# Patient Record
Sex: Female | Born: 1960 | Race: Black or African American | Hispanic: No | Smoking: Never smoker
Health system: Southern US, Community
[De-identification: ages and names within clinical notes are randomized; demographics above are authoritative.]

## PROBLEM LIST (undated history)

## (undated) DIAGNOSIS — N281 Cyst of kidney, acquired: Secondary | ICD-10-CM

## (undated) DIAGNOSIS — D649 Anemia, unspecified: Secondary | ICD-10-CM

## (undated) DIAGNOSIS — M1990 Unspecified osteoarthritis, unspecified site: Secondary | ICD-10-CM

## (undated) DIAGNOSIS — E785 Hyperlipidemia, unspecified: Secondary | ICD-10-CM

## (undated) DIAGNOSIS — G4730 Sleep apnea, unspecified: Secondary | ICD-10-CM

## (undated) DIAGNOSIS — K219 Gastro-esophageal reflux disease without esophagitis: Secondary | ICD-10-CM

## (undated) DIAGNOSIS — H269 Unspecified cataract: Secondary | ICD-10-CM

## (undated) DIAGNOSIS — N189 Chronic kidney disease, unspecified: Secondary | ICD-10-CM

## (undated) DIAGNOSIS — IMO0002 Reserved for concepts with insufficient information to code with codable children: Secondary | ICD-10-CM

## (undated) DIAGNOSIS — I10 Essential (primary) hypertension: Secondary | ICD-10-CM

## (undated) DIAGNOSIS — R011 Cardiac murmur, unspecified: Secondary | ICD-10-CM

## (undated) HISTORY — DX: Gastro-esophageal reflux disease without esophagitis: K21.9

## (undated) HISTORY — PX: FRACTURE SURGERY: SHX138

## (undated) HISTORY — PX: ABDOMINAL HYSTERECTOMY: SHX81

## (undated) HISTORY — PX: PARTIAL HYSTERECTOMY: SHX80

## (undated) HISTORY — DX: Cardiac murmur, unspecified: R01.1

## (undated) HISTORY — DX: Unspecified osteoarthritis, unspecified site: M19.90

## (undated) HISTORY — DX: Anemia, unspecified: D64.9

## (undated) HISTORY — DX: Hyperlipidemia, unspecified: E78.5

## (undated) HISTORY — DX: Reserved for concepts with insufficient information to code with codable children: IMO0002

## (undated) HISTORY — DX: Cyst of kidney, acquired: N28.1

## (undated) HISTORY — DX: Unspecified cataract: H26.9

---

## 2020-03-22 ENCOUNTER — Encounter: Payer: Self-pay | Admitting: Nurse Practitioner

## 2020-03-22 ENCOUNTER — Ambulatory Visit (INDEPENDENT_AMBULATORY_CARE_PROVIDER_SITE_OTHER): Admitting: Nurse Practitioner

## 2020-03-22 ENCOUNTER — Other Ambulatory Visit: Payer: Self-pay | Admitting: Nurse Practitioner

## 2020-03-22 ENCOUNTER — Other Ambulatory Visit: Payer: Self-pay

## 2020-03-22 VITALS — BP 148/80 | HR 70 | Temp 97.9°F | Ht 68.8 in | Wt 222.8 lb

## 2020-03-22 DIAGNOSIS — I10 Essential (primary) hypertension: Secondary | ICD-10-CM

## 2020-03-22 DIAGNOSIS — R609 Edema, unspecified: Secondary | ICD-10-CM | POA: Diagnosis not present

## 2020-03-22 DIAGNOSIS — Z1159 Encounter for screening for other viral diseases: Secondary | ICD-10-CM

## 2020-03-22 DIAGNOSIS — Z13228 Encounter for screening for other metabolic disorders: Secondary | ICD-10-CM | POA: Diagnosis not present

## 2020-03-22 MED ORDER — HYDROCHLOROTHIAZIDE 12.5 MG PO TABS: 12.5000 mg | ORAL_TABLET | Freq: Every day | ORAL | 1 refills | Status: DC

## 2020-03-22 NOTE — Patient Instructions (Signed)

## 2020-03-22 NOTE — Progress Notes (Signed)
This visit occurred during the SARS-CoV-2 public health emergency.  Safety protocols were in place, including screening questions prior to the visit, additional usage of staff PPE, and extensive cleaning of exam room while observing appropriate contact time as indicated for disinfecting solutions.  Subjective:     Patient ID: Nicole Hunt , female    DOB: [birth year] , 59 y.o.   MRN: [id number]   Chief Complaint  Patient presents with  . Establish Care    HPI  Here to establish care, recently retired from the [employer] last year with 30 years of service.  She is divorced. No children.  [place] is home for her she is caring for her mother who is 42 years old.  She is not currently working just caring for her mother.    PMH - history of having a cyst in her left kidney (while she was in [place]).  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  History of mild case of sleep apnea - since being out of the [place] has been sleeping better.  Benign heart murmur.  Right foot with arthritis.   Mother breast cancer - double mastectomy, father - stroke (deceased) , hypertension.    She does report wanting to lose weight. Goal to get to 170-180's.  She wants to continue lifting weights.  Left knee swelling  Hypertension This is a chronic problem. The current episode started more than 1 year ago. The problem is uncontrolled. Pertinent negatives include no anxiety, headaches or shortness of breath. There are no associated agents to hypertension. Risk factors for coronary artery disease include obesity. There are no compliance problems.  There is no history of angina. There is no history of chronic renal disease.     Past Medical History:  Diagnosis Date  . Arthritis   . Heart murmur      Family History  Problem Relation Age of Onset  . Cancer Mother   . Hypothyroidism Mother   . Arthritis Mother   . Stroke Father   .  Hypertension Father   . Hypertension Sister   . Hypertension Brother   . Healthy Brother   . Congestive Heart Failure Maternal Grandmother     No current outpatient medications on file.   No Known Allergies   Review of Systems  Constitutional: Negative.   Respiratory: Negative.  Negative for shortness of breath.   Cardiovascular: Negative.   Neurological: Negative for dizziness and headaches.  Psychiatric/Behavioral: Negative.      Today's Vitals   03/22/20 1511  BP: (!) 164/96  Pulse: 70  Temp: 97.9 F (36.6 C)  TempSrc: Oral  Weight: 222 lb 12.8 oz (101.1 kg)  Height: 5' 8.8" (1.748 m)  PainSc: 0-No pain   Body mass index is 33.09 kg/m.   Objective:  Physical Exam Vitals reviewed.  Constitutional:      General: She is not in acute distress.    Appearance: She is well-developed. She is obese.  HENT:     Head: Normocephalic and atraumatic.  Eyes:     Pupils: Pupils are equal, round, and reactive to light.  Cardiovascular:     Rate and Rhythm: Normal rate and regular rhythm.     Pulses: Normal pulses.     Heart sounds: Normal heart sounds. No murmur heard.   Pulmonary:     Effort: Pulmonary effort is normal. No respiratory distress.     Breath sounds: Normal breath sounds. No wheezing.  Musculoskeletal:  General: Normal range of motion.  Skin:    General: Skin is warm and dry.     Capillary Refill: Capillary refill takes less than 2 seconds.  Neurological:     General: No focal deficit present.     Mental Status: She is alert and oriented to person, place, and time.     Cranial Nerves: No cranial nerve deficit.  Psychiatric:        Mood and Affect: Mood normal.        Thought Content: Thought content normal.        Judgment: Judgment normal.         Assessment And Plan:     1. Encounter for screening for metabolic disorder  - Lipid panel - CBC - Hemoglobin A1c  2. Encounter for hepatitis C screening test for low risk patient  Will  check Hepatitis C screening due to recent recommendations to screen all adults 18 years and older - Hepatitis C antibody  3. Essential hypertension . B/P is elevated today, she is to limit her salt intake and monitor her blood pressure before starting her on medications  . CMP ordered to check renal function.  . The importance of regular exercise and dietary modification was stressed to the patient.  . Stressed importance of losing ten percent of her body weight to help with B/P control.  . The weight loss would help with decreasing cardiac and cancer risk as well.  . EKG done with SR HR 73 - CMP14+EGFR - EKG 12-Lead  4. Edema, unspecified type  Trace edema, encouraged to elevate feet when possible and limit salt intake.  Minette Brine, FNP    THE PATIENT IS ENCOURAGED TO PRACTICE SOCIAL DISTANCING DUE TO THE COVID-19 PANDEMIC.

## 2020-03-23 LAB — CMP14+EGFR
ALT: 34 IU/L — ABNORMAL HIGH (ref 0–32)
AST: 36 IU/L (ref 0–40)
Albumin/Globulin Ratio: 1.3 (ref 1.2–2.2)
Albumin: 4.5 g/dL (ref 3.8–4.9)
Alkaline Phosphatase: 89 IU/L (ref 48–121)
BUN/Creatinine Ratio: 10 (ref 9–23)
BUN: 13 mg/dL (ref 6–24)
Bilirubin Total: 1.7 mg/dL — ABNORMAL HIGH (ref 0.0–1.2)
CO2: 22 mmol/L (ref 20–29)
Calcium: 10.1 mg/dL (ref 8.7–10.2)
Chloride: 106 mmol/L (ref 96–106)
Creatinine, Ser: 1.35 mg/dL — ABNORMAL HIGH (ref 0.57–1.00)
GFR calc Af Amer: 51 mL/min/{1.73_m2} — ABNORMAL LOW (ref >59)
GFR calc non Af Amer: 44 mL/min/{1.73_m2} — ABNORMAL LOW (ref >59)
Globulin, Total: 3.4 g/dL (ref 1.5–4.5)
Glucose: 88 mg/dL (ref 65–99)
Potassium: 4.2 mmol/L (ref 3.5–5.2)
Sodium: 140 mmol/L (ref 134–144)
Total Protein: 7.9 g/dL (ref 6.0–8.5)

## 2020-03-23 LAB — LIPID PANEL
Chol/HDL Ratio: 4.4 ratio (ref 0.0–4.4)
Cholesterol, Total: 189 mg/dL (ref 100–199)
HDL: 43 mg/dL (ref >39)
LDL Chol Calc (NIH): 131 mg/dL — ABNORMAL HIGH (ref 0–99)
Triglycerides: 82 mg/dL (ref 0–149)
VLDL Cholesterol Cal: 15 mg/dL (ref 5–40)

## 2020-03-23 LAB — CBC
Hematocrit: 40.2 % (ref 34.0–46.6)
Hemoglobin: 13.3 g/dL (ref 11.1–15.9)
MCH: 27.5 pg (ref 26.6–33.0)
MCHC: 33.1 g/dL (ref 31.5–35.7)
MCV: 83 fL (ref 79–97)
Platelets: 275 10*3/uL (ref 150–450)
RBC: 4.84 x10E6/uL (ref 3.77–5.28)
RDW: 12.6 % (ref 11.7–15.4)
WBC: 4.8 10*3/uL (ref 3.4–10.8)

## 2020-03-23 LAB — HEMOGLOBIN A1C
Est. average glucose Bld gHb Est-mCnc: 100 mg/dL
Hgb A1c MFr Bld: 5.1 % (ref 4.8–5.6)

## 2020-03-23 LAB — HEPATITIS C ANTIBODY: Hep C Virus Ab: <0.1 s/co ratio (ref 0.0–0.9)

## 2020-04-03 ENCOUNTER — Other Ambulatory Visit: Payer: Self-pay

## 2020-04-03 ENCOUNTER — Ambulatory Visit

## 2020-04-03 VITALS — BP 138/80 | HR 78 | Temp 97.5°F | Ht 68.8 in | Wt 221.2 lb

## 2020-04-03 DIAGNOSIS — I10 Essential (primary) hypertension: Secondary | ICD-10-CM

## 2020-04-03 NOTE — Progress Notes (Signed)
Pt is here for blood pressure check. Pt was put on hctz 12.5 at last visit due to blood pressure being 164/96. Today her blood pressure is 138/80. Pt has folow -up with provider in a month

## 2020-04-04 LAB — CMP14+EGFR
ALT: 22 IU/L (ref 0–32)
AST: 40 IU/L (ref 0–40)
Albumin/Globulin Ratio: 1.5 (ref 1.2–2.2)
Albumin: 4.4 g/dL (ref 3.8–4.9)
Alkaline Phosphatase: 83 IU/L (ref 48–121)
BUN/Creatinine Ratio: 10 (ref 9–23)
BUN: 14 mg/dL (ref 6–24)
Bilirubin Total: 1.5 mg/dL — ABNORMAL HIGH (ref 0.0–1.2)
CO2: 24 mmol/L (ref 20–29)
Calcium: 10.6 mg/dL — ABNORMAL HIGH (ref 8.7–10.2)
Chloride: 102 mmol/L (ref 96–106)
Creatinine, Ser: 1.38 mg/dL — ABNORMAL HIGH (ref 0.57–1.00)
GFR calc Af Amer: 49 mL/min/{1.73_m2} — ABNORMAL LOW (ref >59)
GFR calc non Af Amer: 43 mL/min/{1.73_m2} — ABNORMAL LOW (ref >59)
Globulin, Total: 3 g/dL (ref 1.5–4.5)
Glucose: 93 mg/dL (ref 65–99)
Potassium: 4.7 mmol/L (ref 3.5–5.2)
Sodium: 142 mmol/L (ref 134–144)
Total Protein: 7.4 g/dL (ref 6.0–8.5)

## 2020-05-02 ENCOUNTER — Encounter: Payer: Self-pay | Admitting: Nurse Practitioner

## 2020-05-03 ENCOUNTER — Other Ambulatory Visit: Payer: Self-pay

## 2020-05-03 ENCOUNTER — Encounter: Payer: Self-pay | Admitting: Nurse Practitioner

## 2020-05-03 ENCOUNTER — Ambulatory Visit (INDEPENDENT_AMBULATORY_CARE_PROVIDER_SITE_OTHER): Admitting: Nurse Practitioner

## 2020-05-03 VITALS — BP 158/96 | HR 81 | Temp 97.8°F | Ht 68.8 in | Wt 212.4 lb

## 2020-05-03 DIAGNOSIS — Z87448 Personal history of other diseases of urinary system: Secondary | ICD-10-CM

## 2020-05-03 DIAGNOSIS — I10 Essential (primary) hypertension: Secondary | ICD-10-CM

## 2020-05-03 DIAGNOSIS — N183 Chronic kidney disease, stage 3 unspecified: Secondary | ICD-10-CM | POA: Insufficient documentation

## 2020-05-03 DIAGNOSIS — Z1231 Encounter for screening mammogram for malignant neoplasm of breast: Secondary | ICD-10-CM | POA: Diagnosis not present

## 2020-05-03 MED ORDER — MAGNESIUM 250 MG PO TABS: 1.0000 | ORAL_TABLET | Freq: Every evening | ORAL | 1 refills | Status: AC

## 2020-05-03 NOTE — Progress Notes (Signed)
I,Yamilka Roman [employer] as a Education administrator for [employer], FNP.,have documented all relevant documentation on the behalf of Minette Brine, FNP,as directed by  Minette Brine, FNP while in the presence of Minette Brine, [place].  This visit occurred during the SARS-CoV-2 public health emergency.  Safety protocols were in place, including screening questions prior to the visit, additional usage of staff PPE, and extensive cleaning of exam room while observing appropriate contact time as indicated for disinfecting solutions.  Subjective:     Patient ID: Nicole Hunt , female    DOB: [birth year] , 59 y.o.   MRN: [id number]   Chief Complaint  Patient presents with  . Hypertension    Med f/u     HPI  Here for recheck blood pressure and swelling in feet.  She says she now can see her ankles. She just drank water today.    At home blood pressure at home was 144/59.      Past Medical History:  Diagnosis Date  . Arthritis   . Heart murmur      Family History  Problem Relation Age of Onset  . Cancer Mother   . Hypothyroidism Mother   . Arthritis Mother   . Stroke Father   . Hypertension Father   . Hypertension Sister   . Hypertension Brother   . Healthy Brother   . Congestive Heart Failure Maternal Grandmother      Current Outpatient Medications:  .  hydrochlorothiazide (HYDRODIURIL) 12.5 MG tablet, TAKE 1 TABLET(12.5 MG) BY MOUTH DAILY, Disp: 90 tablet, Rfl: 0 .  Magnesium 250 MG TABS, Take 1 tablet (250 mg total) by mouth every evening. Take with evening meal., Disp: 90 tablet, Rfl: 1   No Known Allergies   Review of Systems  Constitutional: Negative.  Negative for fatigue.  Respiratory: Negative.  Negative for cough, shortness of breath and wheezing.   Cardiovascular: Negative.  Negative for chest pain, palpitations and leg swelling.  Genitourinary: Negative.   Neurological: Negative for dizziness and headaches.  Psychiatric/Behavioral: Negative.      Today's Vitals    05/03/20 1043  BP: (!) 158/96  Pulse: 81  Temp: 97.8 F (36.6 C)  TempSrc: Oral  Weight: 212 lb 6.4 oz (96.3 kg)  Height: 5' 8.8" (1.748 m)  PainSc: 0-No pain   Body mass index is 31.55 kg/m.   Objective:  Physical Exam Vitals reviewed.  Constitutional:      General: She is not in acute distress.    Appearance: Normal appearance. She is well-developed.  HENT:     Head: Normocephalic and atraumatic.  Eyes:     Pupils: Pupils are equal, round, and reactive to light.  Cardiovascular:     Rate and Rhythm: Normal rate and regular rhythm.     Pulses: Normal pulses.     Heart sounds: Normal heart sounds. No murmur heard.   Pulmonary:     Effort: Pulmonary effort is normal. No respiratory distress.     Breath sounds: Normal breath sounds. No wheezing.  Musculoskeletal:        General: Normal range of motion.     Right lower leg: No edema.     Left lower leg: No edema.  Skin:    General: Skin is warm and dry.     Capillary Refill: Capillary refill takes less than 2 seconds.  Neurological:     General: No focal deficit present.     Mental Status: She is alert and oriented to person, place, and time.  Cranial Nerves: No cranial nerve deficit.  Psychiatric:        Mood and Affect: Mood normal.        Behavior: Behavior normal.        Thought Content: Thought content normal.        Judgment: Judgment normal.         Assessment And Plan:     1. Essential hypertension  Chronic, this is better slightly from her last visit   Her blood pressure at home is better as well   I will add magnesium and she is to check her blood pressure at home daily and send [place] the readings weekly  Return in 4 weeks for nurse visit BP check - Magnesium 250 MG TABS; Take 1 tablet (250 mg total) by mouth every evening. Take with evening meal.  Dispense: 90 tablet; Refill: 1 - US Renal; Future  2. Personal history of simple renal cyst  Reports a history over the last 20 years that was  managed by the [place], I will check a renal ultrasound before referring to nephrology - US Renal; Future  3. Encounter for screening mammogram for malignant neoplasm of breast  Pt instructed on Self Breast Exam.According to ACOG guidelines Women aged 64 and older are recommended to get an annual mammogram. Form completed and given to patient contact the [facility] for appointment scheduing.   Pt encouraged to get annual mammogram - MM DIGITAL SCREENING BILATERAL; Future    Patient was given opportunity to ask questions. Patient verbalized understanding of the plan and was able to repeat key elements of the plan. All questions were answered to their satisfaction.  Minette Brine, FNP   I, Minette Brine, FNP, have reviewed all documentation for this visit. The documentation on 05/03/20 for the exam, diagnosis, procedures, and orders are all accurate and complete.  THE PATIENT IS ENCOURAGED TO PRACTICE SOCIAL DISTANCING DUE TO THE COVID-19 PANDEMIC.

## 2020-05-10 ENCOUNTER — Ambulatory Visit
Admission: RE | Admit: 2020-05-10 | Discharge: 2020-05-10 | Disposition: A | Discharge status: Home / Self Care | Source: Ambulatory Visit | Attending: Nurse Practitioner | Admitting: Nurse Practitioner

## 2020-05-10 DIAGNOSIS — Z87448 Personal history of other diseases of urinary system: Secondary | ICD-10-CM

## 2020-05-10 DIAGNOSIS — I10 Essential (primary) hypertension: Secondary | ICD-10-CM

## 2020-05-10 IMAGING — US US RENAL
1 series · 14 of 25 positions shown · non-contrast
Comparison: None.

CLINICAL DATA: Renal cyst.

EXAM:
RENAL / URINARY TRACT ULTRASOUND COMPLETE

[Series 1: us renal · 0.23mm/px · 14 of 37 slices shown]
[im 1/37]
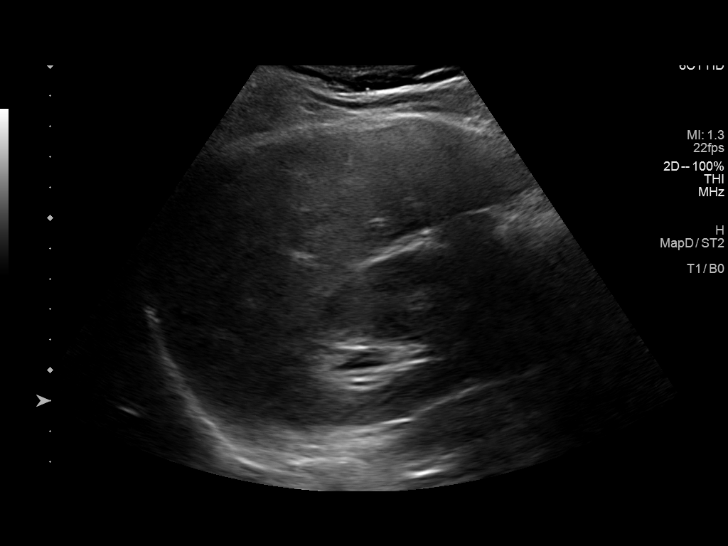
[im 4/37]
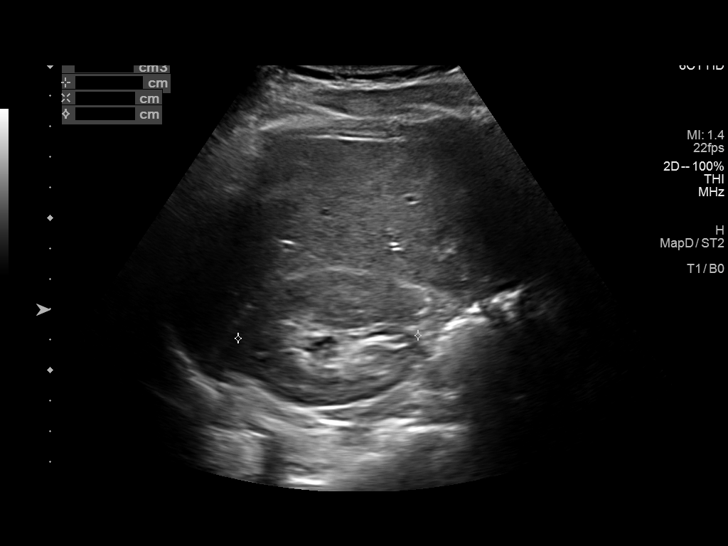
[im 7/37]
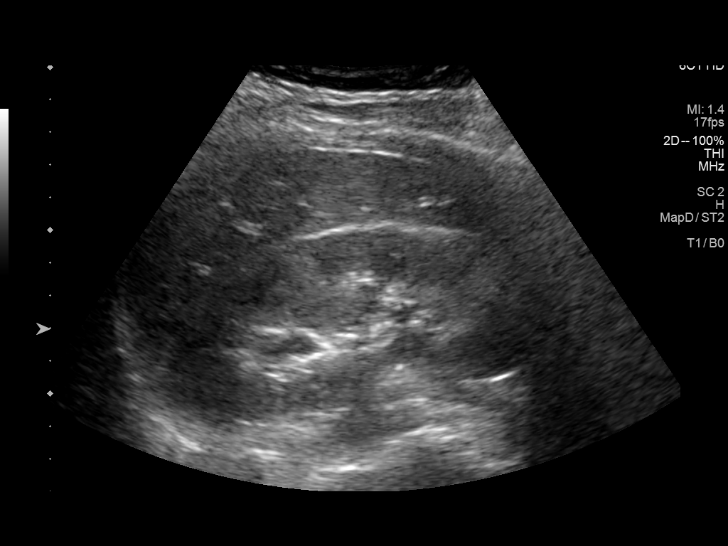
[im 10/37]
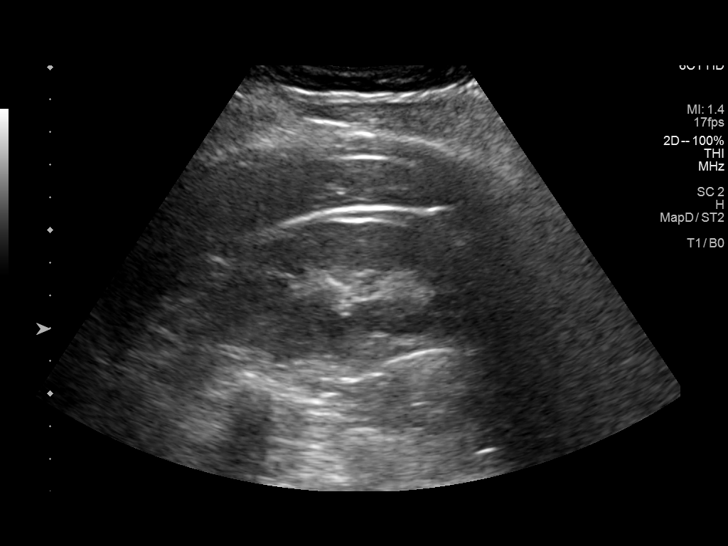
[im 13/37]
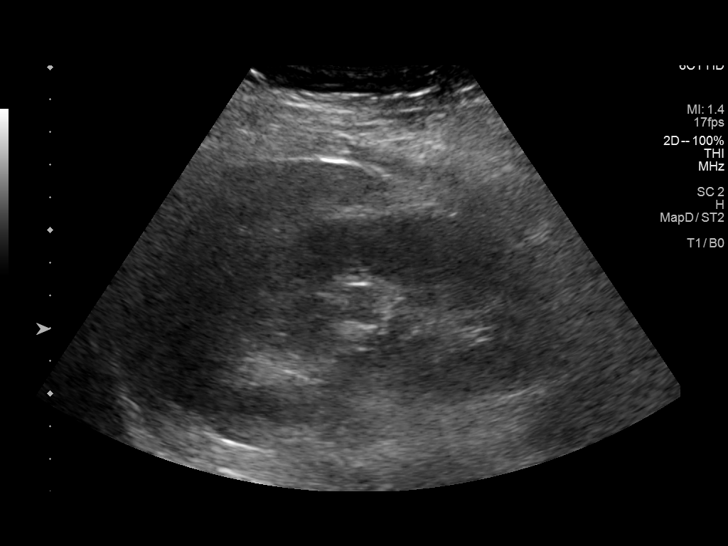
[im 14/37]
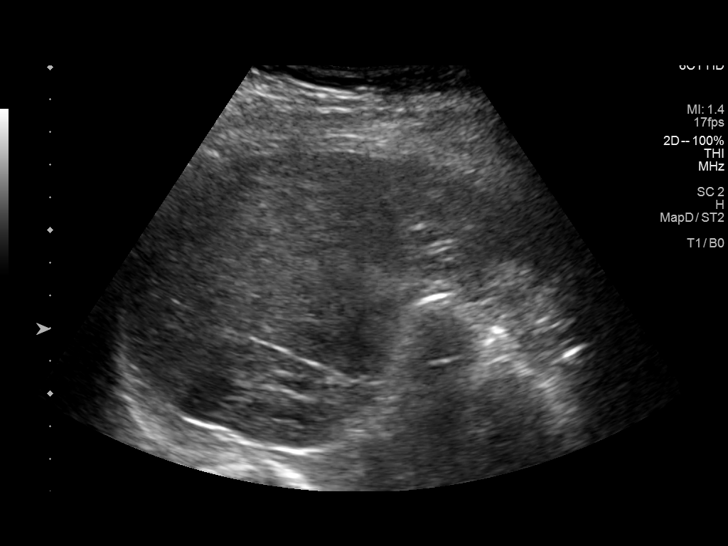
[im 17/37]
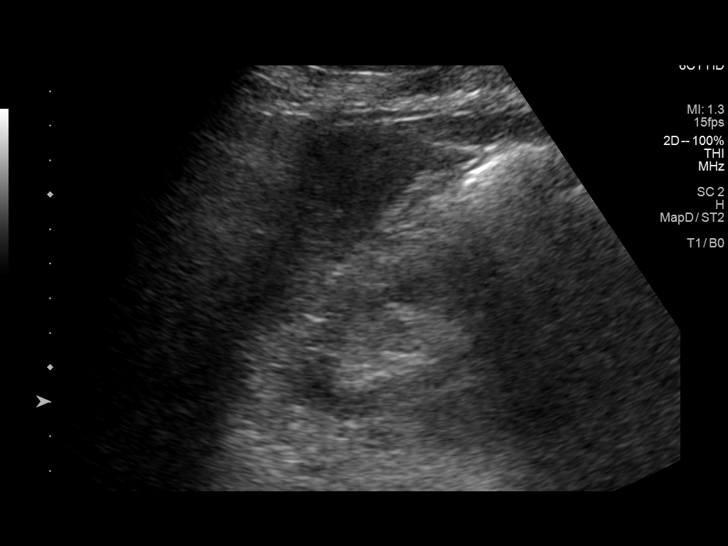
[im 20/37]
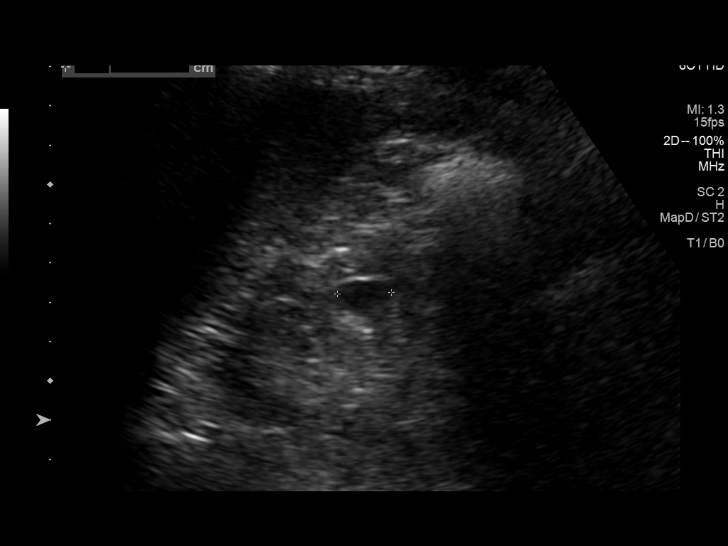
[im 23/37]
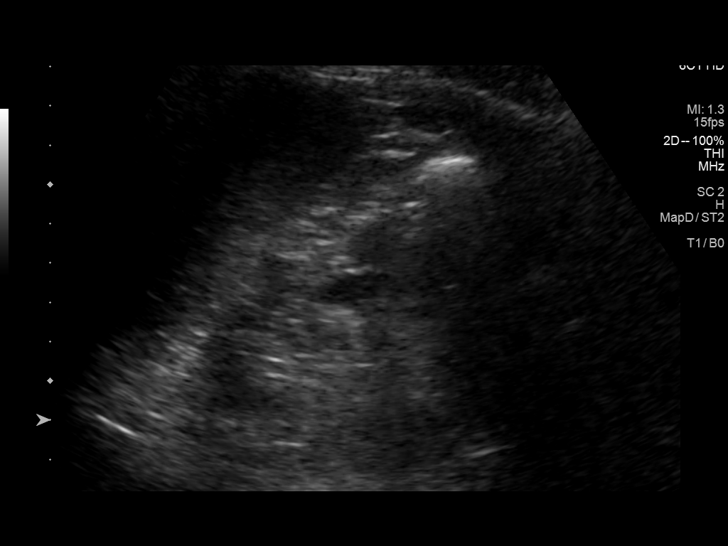
[im 25/37]
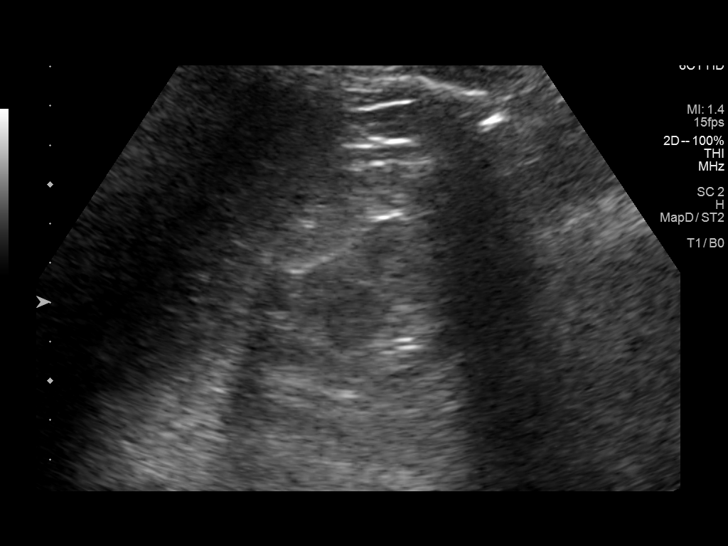
[im 28/37]
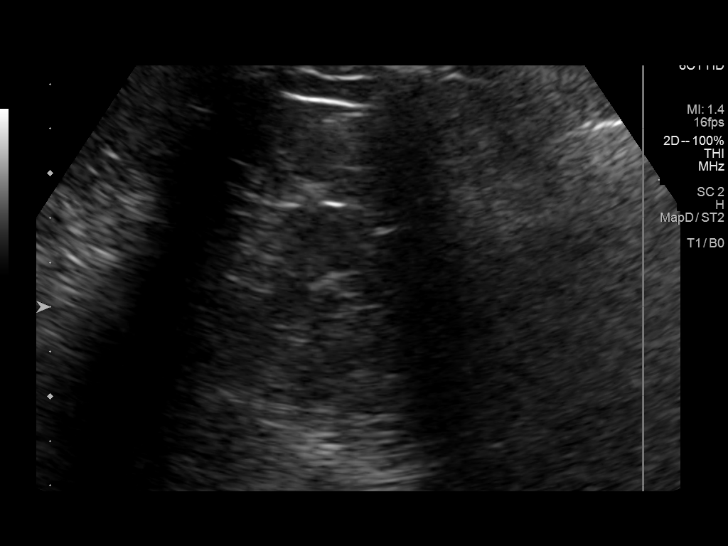
[im 31/37]
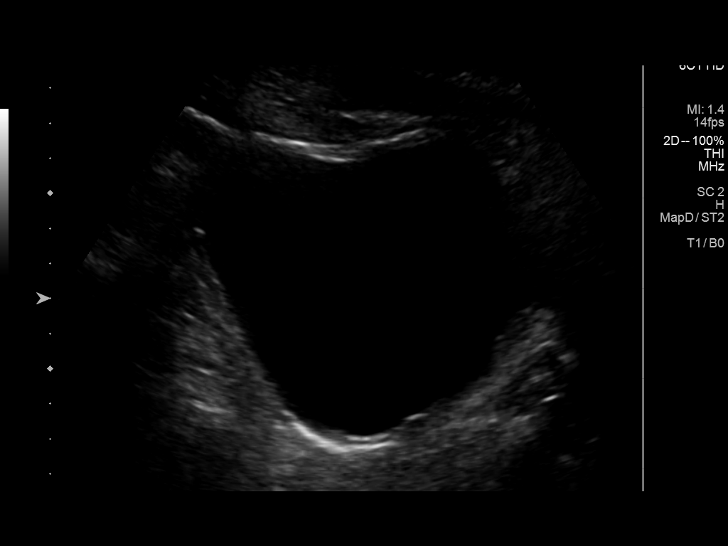
[im 34/37]
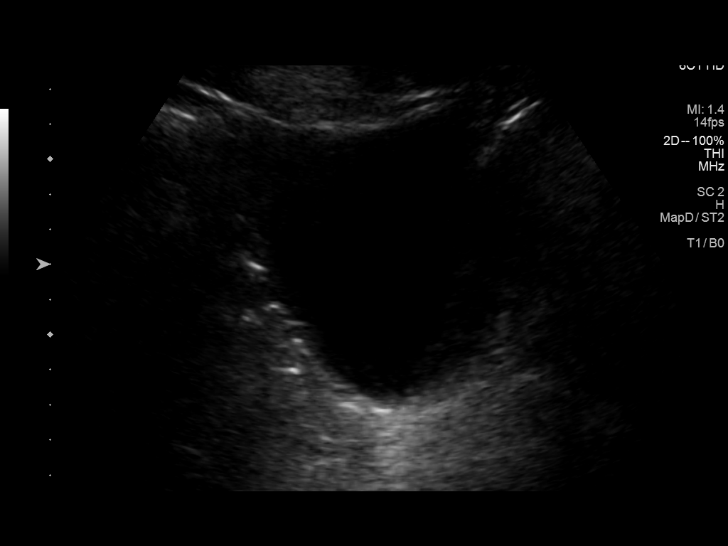
[im 37/37]
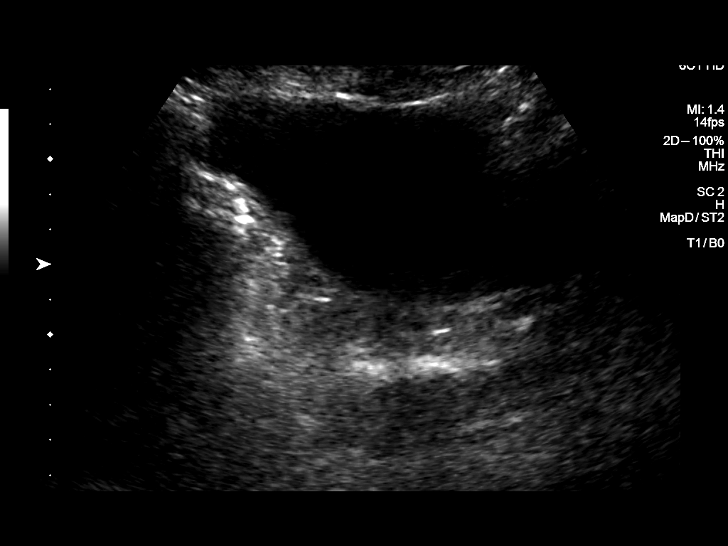

[14 of 25 positions shown; findings below may reference images not displayed]

FINDINGS: Right Kidney:

Renal measurements: 11.9 x 4.4 x 3.9 cm = volume: 159 mL.
Echogenicity within normal limits. No mass or hydronephrosis
visualized.

Left Kidney:

Renal measurements: Poorly visualized, likely as a result of
increased echogenicity and overlying bowel gas. Potential tiny 10 mm
hypoechoic lesion in the interpolar region. No definite
hydronephrosis.

Bladder:

Appears normal for degree of bladder distention.

Other:

Large volume of bowel gas in the left abdomen.
IMPRESSION: 1. Limited visualization of the left kidney. Potential 10 mm
hypoechoic lesion in the interpolar region. MRI of the abdomen
without and with contrast recommended to further evaluate.
2. Unremarkable right kidney.

## 2020-05-23 ENCOUNTER — Ambulatory Visit
Admission: RE | Admit: 2020-05-23 | Discharge: 2020-05-23 | Disposition: A | Discharge status: Home / Self Care | Source: Ambulatory Visit | Attending: Nurse Practitioner | Admitting: Nurse Practitioner

## 2020-05-23 ENCOUNTER — Other Ambulatory Visit: Payer: Self-pay

## 2020-05-23 DIAGNOSIS — Z1231 Encounter for screening mammogram for malignant neoplasm of breast: Secondary | ICD-10-CM

## 2020-05-23 IMAGING — MG DIGITAL SCREENING BILAT W/ CAD
4 series · 4 of 4 positions shown · non-contrast
Comparison: Previous exam(s).

CLINICAL DATA: Screening.

EXAM:
DIGITAL SCREENING BILATERAL MAMMOGRAM WITH CAD

[L CC]
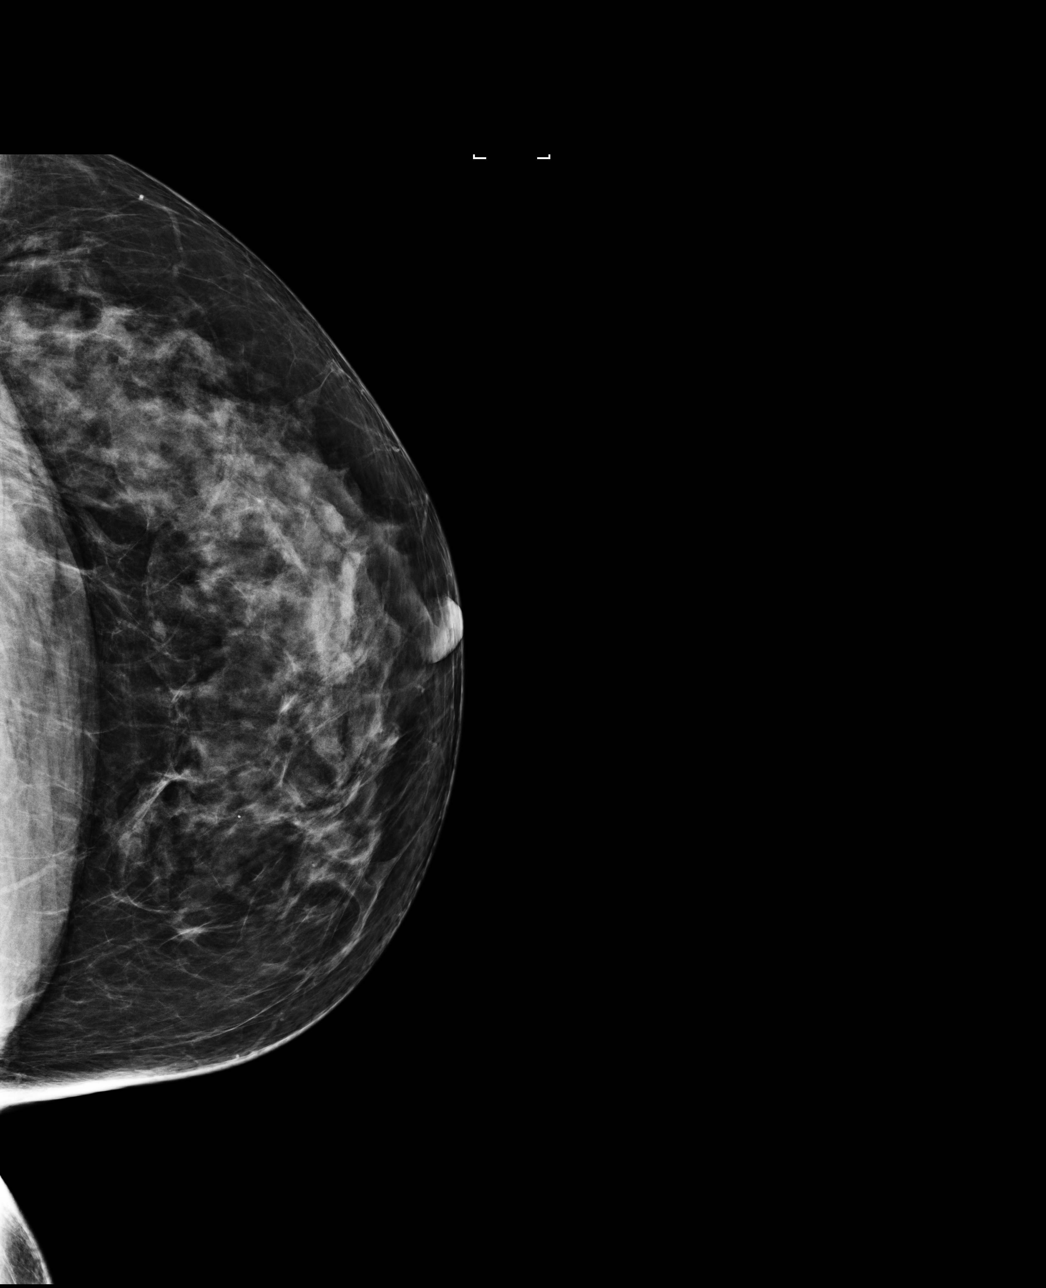

[R MLO]
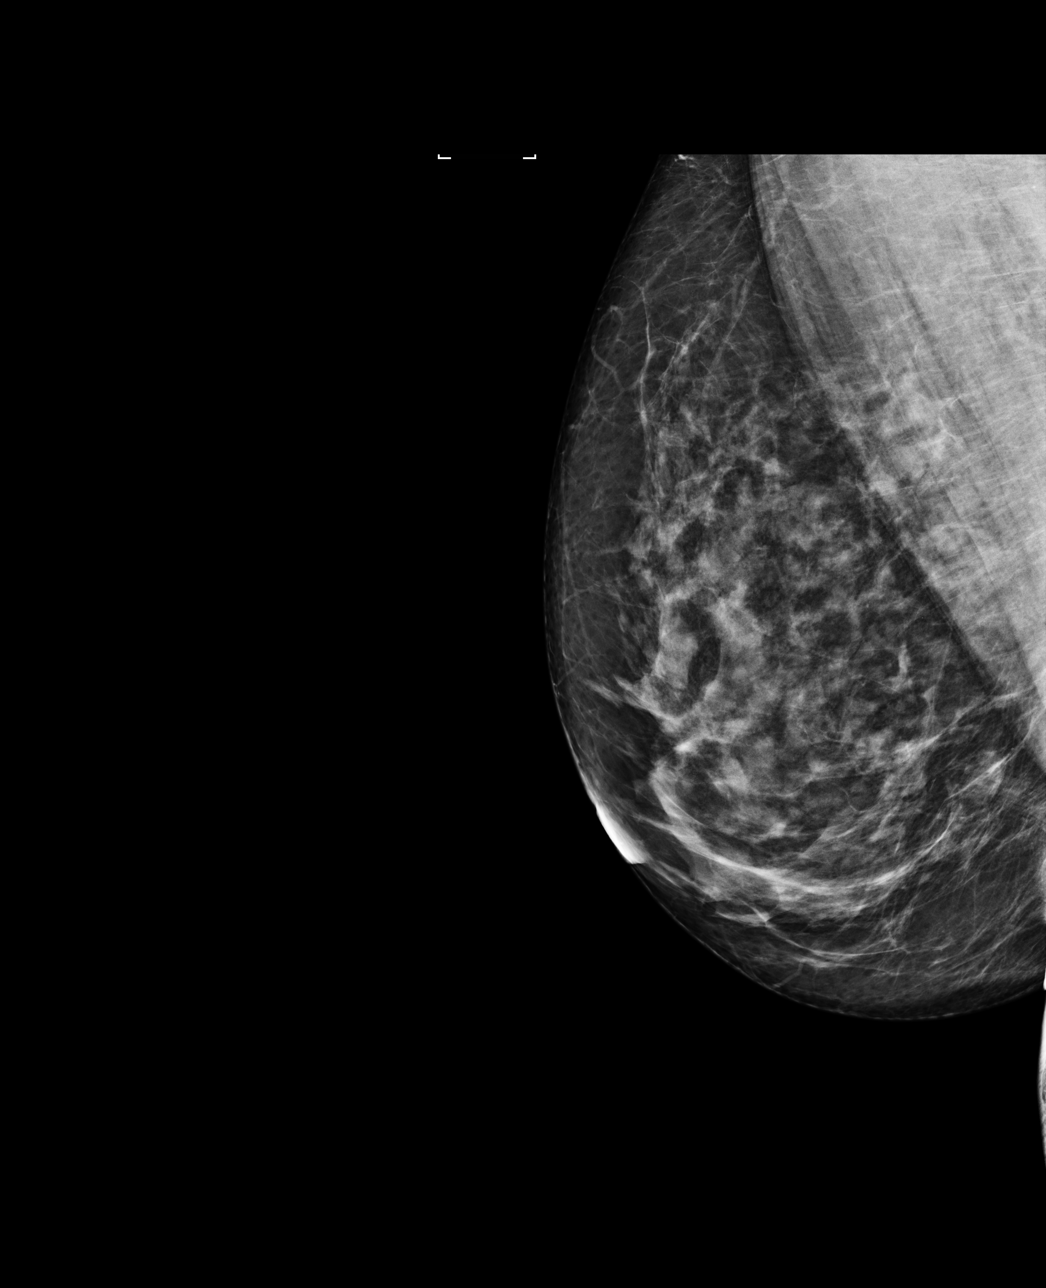

[R CC]
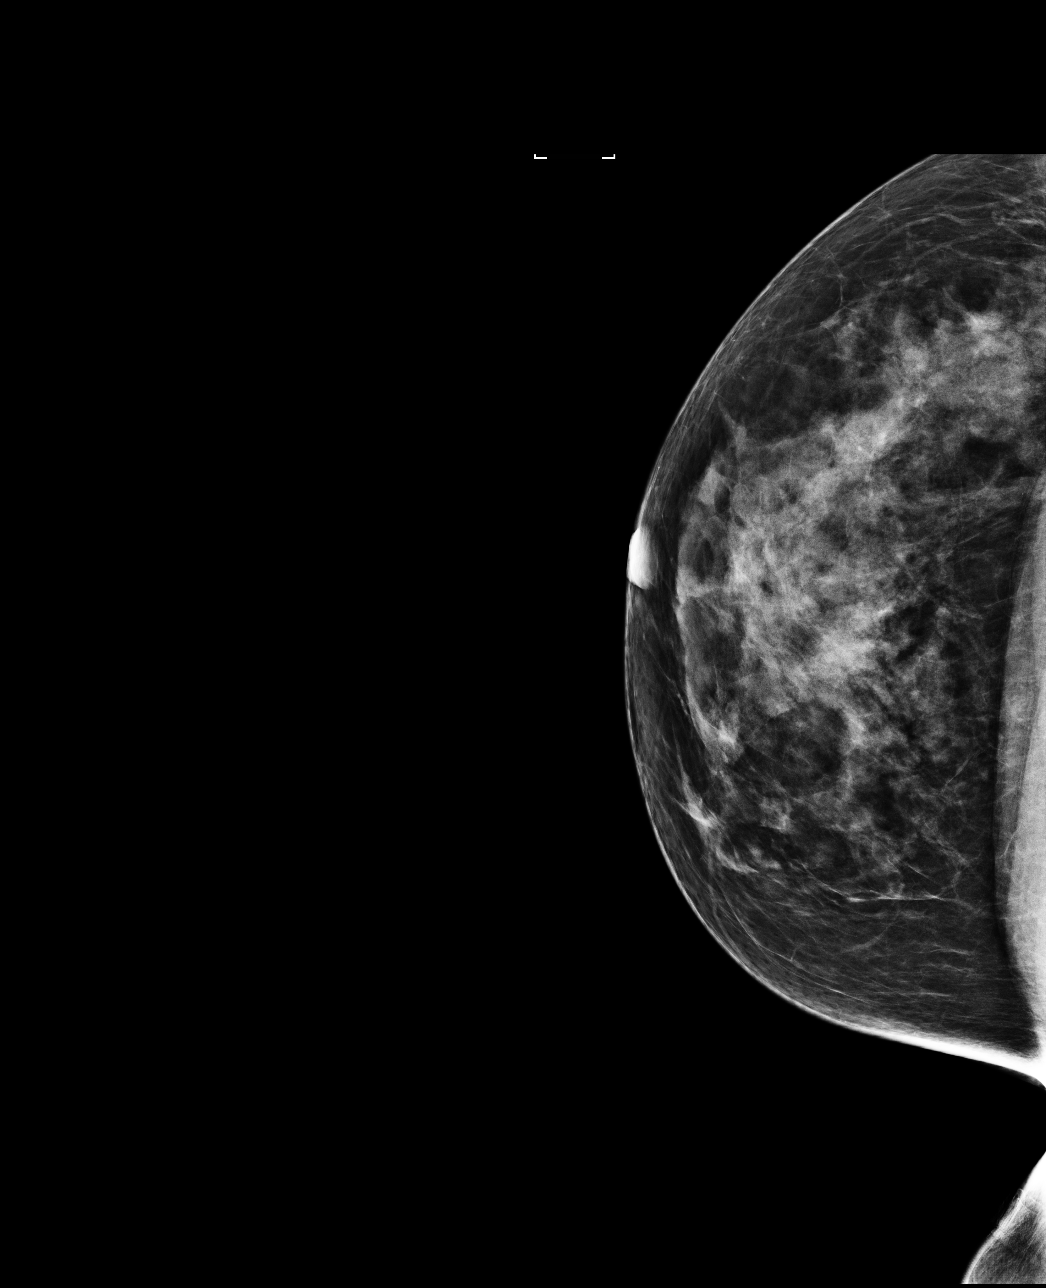

[L MLO]
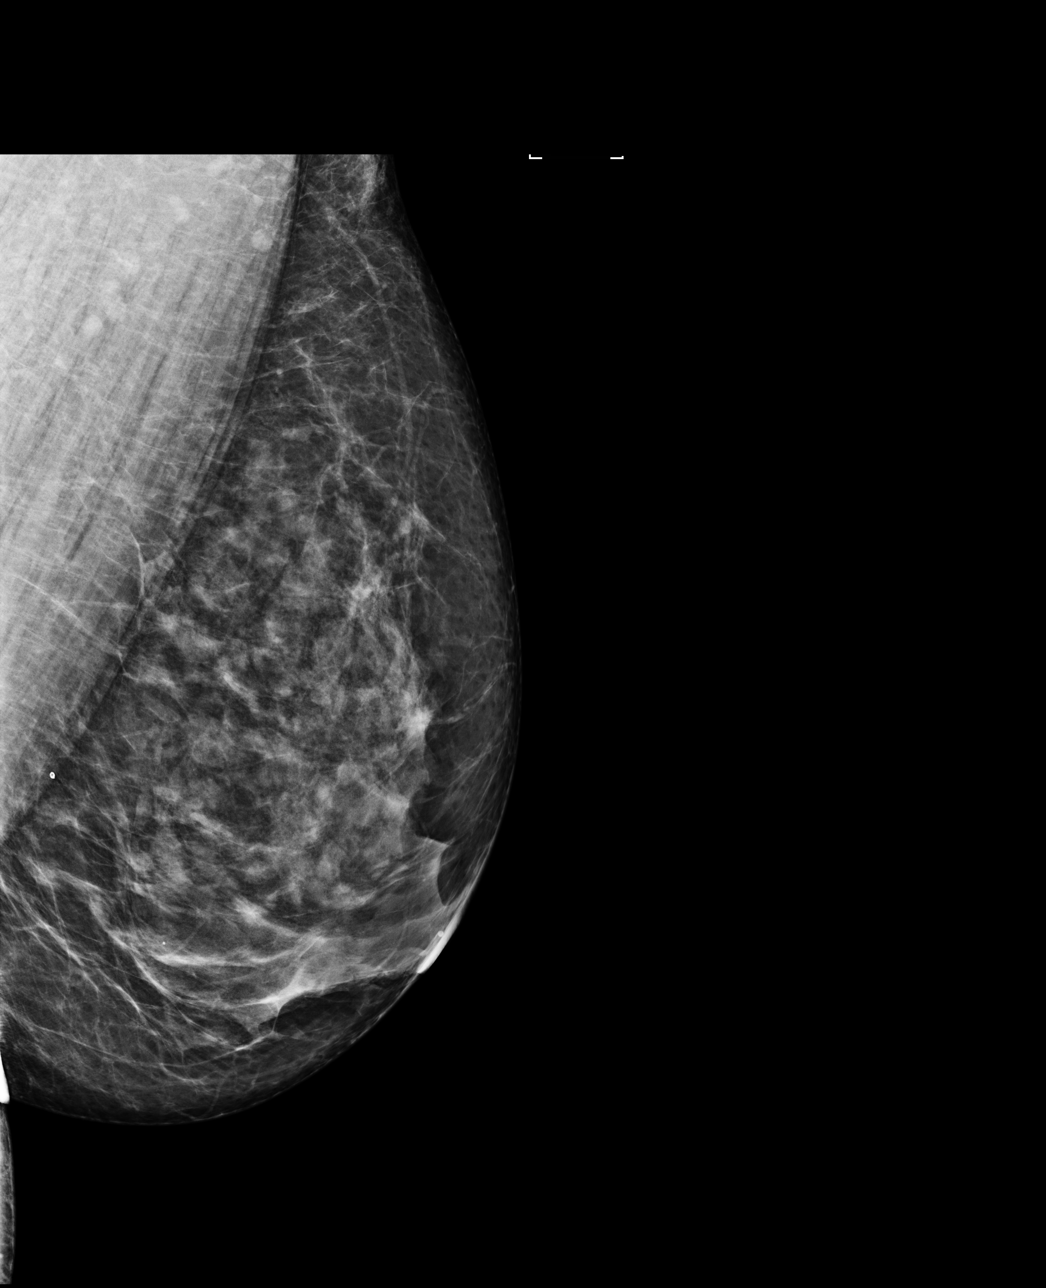

[4 of 4 positions shown; findings below may reference images not displayed]

ACR Breast Density Category c: The breast tissue is heterogeneously
dense, which may obscure small masses.
FINDINGS: There are no findings suspicious for malignancy. Images were
processed with CAD.
IMPRESSION: No mammographic evidence of malignancy. A result letter of this
screening mammogram will be mailed directly to the patient.

RECOMMENDATION:
Screening mammogram in one year. (Code:[0J])

BI-RADS CATEGORY  1: Negative.

## 2020-06-01 ENCOUNTER — Other Ambulatory Visit: Payer: Self-pay | Admitting: Nurse Practitioner

## 2020-06-01 DIAGNOSIS — R93429 Abnormal radiologic findings on diagnostic imaging of unspecified kidney: Secondary | ICD-10-CM

## 2020-06-05 ENCOUNTER — Other Ambulatory Visit: Payer: Self-pay

## 2020-06-05 ENCOUNTER — Ambulatory Visit (INDEPENDENT_AMBULATORY_CARE_PROVIDER_SITE_OTHER)

## 2020-06-05 VITALS — BP 144/88 | HR 78 | Temp 98.1°F | Ht 68.8 in | Wt 205.0 lb

## 2020-06-05 DIAGNOSIS — I10 Essential (primary) hypertension: Secondary | ICD-10-CM

## 2020-06-05 NOTE — Progress Notes (Signed)
Pt here today for a b/p visit. She is currently taking hctz 12.5 tab in the mornings. She has not taken her medication yet this morning, her b/p in the office today is 144/88  Per JM: Okay that is good, no changes to medications  make sure she has an appt at least in 3 months  Pt has physical scheduled for Nov 2021

## 2020-06-15 ENCOUNTER — Other Ambulatory Visit: Payer: Self-pay | Admitting: Nurse Practitioner

## 2020-06-15 DIAGNOSIS — I10 Essential (primary) hypertension: Secondary | ICD-10-CM

## 2020-08-06 ENCOUNTER — Encounter: Admitting: Nurse Practitioner

## 2020-08-14 NOTE — Progress Notes (Signed)
This visit occurred during the SARS-CoV-2 public health emergency.  Safety protocols were in place, including screening questions prior to the visit, additional usage of staff PPE, and extensive cleaning of exam room while observing appropriate contact time as indicated for disinfecting solutions.  Subjective:     Patient ID: Nicole Hunt , female    DOB: 02-May-1961 , 59 y.o.   MRN: [id number]   Chief Complaint  Patient presents with   Annual Exam    HPI  Patient is here for a physical exam. She has no concerns today. Her BP is elevated in office. She reports taking her medication daily. Prior referral was made for patient for the urologist however she did not follow up with them yet.   Pap smear done last year in the [place] and she will bring a copy of that.  Not sexually active.  Mammogram completed August 2021    Exercise: she lifts weight 4-5 times a week. Walks her sisters dog 5-7 times a week. She is thinking about starting to run.   Diet: Eats well. She eats meats and vegetables. She avoids sweets. Fruits and vegetables. Avoid fried  EKG done this year in [facility].  Eye exam this year.   Hypertension This is a chronic problem. The current episode started more than 1 month ago. The problem has been gradually worsening since onset. The problem is uncontrolled. Pertinent negatives include no headaches, palpitations or shortness of breath. Risk factors for coronary artery disease include sedentary lifestyle. Past treatments include diuretics. The current treatment provides mild improvement. Compliance problems include diet and exercise.      Past Medical History:  Diagnosis Date   Arthritis    Heart murmur      Family History  Problem Relation Age of Onset   Cancer Mother    Hypothyroidism Mother    Arthritis Mother    Stroke Father    Hypertension Father    Hypertension Sister    Hypertension Brother    Healthy Brother    Congestive Heart Failure  Maternal Grandmother      Current Outpatient Medications:    hydrochlorothiazide (HYDRODIURIL) 12.5 MG tablet, TAKE 1 TABLET(12.5 MG) BY MOUTH DAILY, Disp: 90 tablet, Rfl: 0   Magnesium 250 MG TABS, Take 1 tablet (250 mg total) by mouth every evening. Take with evening meal., Disp: 90 tablet, Rfl: 1   amLODipine (NORVASC) 5 MG tablet, Take 1 tablet (5 mg total) by mouth daily., Disp: 30 tablet, Rfl: 11   No Known Allergies    The patient states she uses none for birth control. Last LMP was  (has a partial hystrectomy)  No LMP recorded. (Menstrual status: Other)..  Negative for: breast discharge, breast lump(s), breast pain and breast self exam. Associated symptoms include abnormal vaginal bleeding. Pertinent negatives include abnormal bleeding (hematology), anxiety, decreased libido, depression, difficulty falling sleep, dyspareunia, history of infertility, nocturia, sexual dysfunction, sleep disturbances, urinary incontinence, urinary urgency, vaginal discharge and vaginal itching.  Diet regular. She does not have a diet right now but will work on it. The patient states her exercise level is  that she lifts weight and walks the dog but will start to jog/run more.   . The patient's tobaccno use is none.  Social History   Tobacco Use  Smoking Status Never Smoker  Smokeless Tobacco Never Used  . She has been exposed to passive smoke. The patient's alcohol use is:  Social History   Substance and Sexual Activity  Alcohol Use Never  .  Additional information: Last pap (will bring in copy from [facility]), next one scheduled for .    Review of Systems  Constitutional: Negative for fatigue and fever.  HENT: Negative for facial swelling and sneezing.   Eyes: Negative for pain and visual disturbance.  Respiratory: Negative for cough, shortness of breath and wheezing.   Cardiovascular: Negative.  Negative for palpitations and leg swelling.  Gastrointestinal: Negative for constipation, diarrhea  and nausea.  Endocrine: Negative for cold intolerance, heat intolerance, polydipsia, polyphagia and polyuria.  Genitourinary: Negative for frequency.  Musculoskeletal: Negative for gait problem and myalgias.  Skin: Negative.   Neurological: Negative for dizziness, weakness, numbness and headaches.  Psychiatric/Behavioral: Negative for behavioral problems.     Today's Vitals   08/15/20 0836  BP: (!) 158/90  Pulse: 91  Temp: 98.1 F (36.7 C)  TempSrc: Oral  Weight: 205 lb 12.8 oz (93.4 kg)  Height: 5' 8.4" (1.737 m)   Body mass index is 30.93 kg/m.   Objective:  Physical Exam Constitutional:      Appearance: Normal appearance. She is obese.  HENT:     Head: Normocephalic.     Right Ear: Tympanic membrane and ear canal normal.     Left Ear: Tympanic membrane and ear canal normal.     Nose:     Comments: Deferred patient masked     Mouth/Throat:     Comments: Deferred patient masked.  Eyes:     Extraocular Movements: Extraocular movements intact.     Conjunctiva/sclera: Conjunctivae normal.     Pupils: Pupils are equal, round, and reactive to light.  Cardiovascular:     Rate and Rhythm: Normal rate and regular rhythm.     Pulses: Normal pulses.     Heart sounds: Murmur heard.   Pulmonary:     Effort: Pulmonary effort is normal. No respiratory distress.     Breath sounds: Normal breath sounds. No wheezing.  Abdominal:     General: Bowel sounds are normal.     Palpations: Abdomen is soft.  Genitourinary:    Comments: Deferred. Patient had pap smear while she was in [employer] [facility] last year. Patient will bring in records.  Musculoskeletal:        General: No swelling or tenderness. Normal range of motion.     Cervical back: Normal range of motion. No tenderness.     Right lower leg: No edema.     Left lower leg: No edema.  Skin:    General: Skin is warm and dry.     Capillary Refill: Capillary refill takes less than 2 seconds.  Neurological:     Mental Status: She is  alert and oriented to person, place, and time. Mental status is at baseline.     Motor: No weakness.     Gait: Gait normal.  Psychiatric:        Mood and Affect: Mood normal.        Behavior: Behavior normal.        Thought Content: Thought content normal.        Judgment: Judgment normal.         Assessment And Plan:     1. Encounter for annual physical exam - CBC - Hemoglobin A1c - CMP14+EGFR - Lipid panel - HIV Antibody (routine testing w rflx) -Behavior modification discussed and diet history reviewed.  -Pt continue to exercise regularly and will incorporate jogging and running to her routine.  -Recommend intake of daily multivitamin, vitamin D and calcium -Recommended the importance  of mammogram, screening and immunization.   2. Essential hypertension - POCT Urinalysis Dipstick (81002) - POCT UA - microalbumin -Decrease salt intake and processed food while increasing a healthy diet and regular exercise.  -Continue taking hydrochlorothiazide 12.5 mg once daily.  - Added amLODipine (NORVASC) 5 MG tablet; Take 1 tablet (5 mg total) by mouth daily.  Dispense: 30 tablet; Refill: 11 -Patient will come back in 2 weeks for a RN visit to assess the BP medication on her BP control.   3. Class 1 obesity due to excess calories with serious comorbidity and body mass index (BMI) of 30.0 to 30.9 in adult -Discussed healthy diet and regular exercise options to include chair exercise and walking/running as tolerated.  -Encourage exercise for atleast 4-5 times a weak for 30-45 min.   4. Encounter for immunization - Flu Vaccine QUAD 6+ mos PF IM (Fluarix Quad PF) received today   5. Leukocytes in urine - Culture, Urine -Will assess and treat once culture comes back.  -patient had no symptoms of UTI in office.   Patient was given opportunity to ask questions. Patient verbalized understanding of the plan and was able to repeat key elements of the plan. All questions were answered to  their satisfaction.   Bary Castilla, NP   I, Bary Castilla, NP, have reviewed all documentation for this visit. The documentation on 08/15/20 for the exam, diagnosis, procedures, and orders are all accurate and complete.  THE PATIENT IS ENCOURAGED TO PRACTICE SOCIAL DISTANCING DUE TO THE COVID-19 PANDEMIC.

## 2020-08-15 ENCOUNTER — Encounter: Payer: Self-pay | Admitting: Nurse Practitioner

## 2020-08-15 ENCOUNTER — Ambulatory Visit: Admitting: Nurse Practitioner

## 2020-08-15 ENCOUNTER — Other Ambulatory Visit: Payer: Self-pay

## 2020-08-15 ENCOUNTER — Other Ambulatory Visit: Payer: Self-pay | Admitting: Nurse Practitioner

## 2020-08-15 VITALS — BP 158/90 | HR 91 | Temp 98.1°F | Ht 68.4 in | Wt 205.8 lb

## 2020-08-15 DIAGNOSIS — Z6830 Body mass index (BMI) 30.0-30.9, adult: Secondary | ICD-10-CM

## 2020-08-15 DIAGNOSIS — E6609 Other obesity due to excess calories: Secondary | ICD-10-CM

## 2020-08-15 DIAGNOSIS — Z0000 Encounter for general adult medical examination without abnormal findings: Secondary | ICD-10-CM

## 2020-08-15 DIAGNOSIS — R82998 Other abnormal findings in urine: Secondary | ICD-10-CM | POA: Diagnosis not present

## 2020-08-15 DIAGNOSIS — Z23 Encounter for immunization: Secondary | ICD-10-CM | POA: Diagnosis not present

## 2020-08-15 DIAGNOSIS — I10 Essential (primary) hypertension: Secondary | ICD-10-CM | POA: Diagnosis not present

## 2020-08-15 LAB — POCT URINALYSIS DIPSTICK
Bilirubin, UA: NEGATIVE
Glucose, UA: NEGATIVE
Ketones, UA: NEGATIVE
Nitrite, UA: NEGATIVE
Protein, UA: POSITIVE — AB
Spec Grav, UA: 1.025 (ref 1.010–1.025)
Urobilinogen, UA: 0.2 E.U./dL
pH, UA: 6.5 (ref 5.0–8.0)

## 2020-08-15 LAB — POCT UA - MICROALBUMIN
Albumin/Creatinine Ratio, Urine, POC: 300
Creatinine, POC: 200 mg/dL
Microalbumin Ur, POC: 80 mg/L

## 2020-08-15 MED ORDER — AMLODIPINE BESYLATE 5 MG PO TABS: 5.0000 mg | ORAL_TABLET | Freq: Every day | ORAL | 11 refills | Status: DC

## 2020-08-15 NOTE — Patient Instructions (Addendum)
Please make an appointment and follow up with the urologist:   [facility]/Dr Karsten Ro  [phone]        Health Maintenance, Female Adopting a healthy lifestyle and getting preventive care are important in promoting health and wellness. Ask your health care provider about:  The right schedule for you to have regular tests and exams.  Things you can do on your own to prevent diseases and keep yourself healthy. What should I know about diet, weight, and exercise? Eat a healthy diet   Eat a diet that includes plenty of vegetables, fruits, low-fat dairy products, and lean protein.  Do not eat a lot of foods that are high in solid fats, added sugars, or sodium. Maintain a healthy weight Body mass index (BMI) is used to identify weight problems. It estimates body fat based on height and weight. Your health care provider can help determine your BMI and help you achieve or maintain a healthy weight. Get regular exercise Get regular exercise. This is one of the most important things you can do for your health. Most adults should:  Exercise for at least 150 minutes each week. The exercise should increase your heart rate and make you sweat (moderate-intensity exercise).  Do strengthening exercises at least twice a week. This is in addition to the moderate-intensity exercise.  Spend less time sitting. Even light physical activity can be beneficial. Watch cholesterol and blood lipids Have your blood tested for lipids and cholesterol at 57 years of age, then have this test every 5 years. Have your cholesterol levels checked more often if:  Your lipid or cholesterol levels are high.  You are older than 57 years of age.  You are at high risk for heart disease. What should I know about cancer screening? Depending on your health history and family history, you may need to have cancer screening at various ages. This may include screening for:  Breast cancer.  Cervical  cancer.  Colorectal cancer.  Skin cancer.  Lung cancer. What should I know about heart disease, diabetes, and high blood pressure? Blood pressure and heart disease  High blood pressure causes heart disease and increases the risk of stroke. This is more likely to develop in people who have high blood pressure readings, are of African descent, or are overweight.  Have your blood pressure checked: ? Every 3-5 years if you are 75-78 years of age. ? Every year if you are 5 years old or older. Diabetes Have regular diabetes screenings. This checks your fasting blood sugar level. Have the screening done:  Once every three years after age 35 if you are at a normal weight and have a low risk for diabetes.  More often and at a younger age if you are overweight or have a high risk for diabetes. What should I know about preventing infection? Hepatitis B If you have a higher risk for hepatitis B, you should be screened for this virus. Talk with your health care provider to find out if you are at risk for hepatitis B infection. Hepatitis C Testing is recommended for:  Everyone born from [birth year] through 1963.  Anyone with known risk factors for hepatitis C. Sexually transmitted infections (STIs)  Get screened for STIs, including gonorrhea and chlamydia, if: ? You are sexually active and are younger than 57 years of age. ? You are older than 57 years of age and your health care provider tells you that you are at risk for this type of infection. ? Your sexual  activity has changed since you were last screened, and you are at increased risk for chlamydia or gonorrhea. Ask your health care provider if you are at risk.  Ask your health care provider about whether you are at high risk for HIV. Your health care provider may recommend a prescription medicine to help prevent HIV infection. If you choose to take medicine to prevent HIV, you should first get tested for HIV. You should then be tested every 3  months for as long as you are taking the medicine. Pregnancy  If you are about to stop having your period (premenopausal) and you may become pregnant, seek counseling before you get pregnant.  Take 400 to 800 micrograms (mcg) of folic acid every day if you become pregnant.  Ask for birth control (contraception) if you want to prevent pregnancy. Osteoporosis and menopause Osteoporosis is a disease in which the bones lose minerals and strength with aging. This can result in bone fractures. If you are 46 years old or older, or if you are at risk for osteoporosis and fractures, ask your health care provider if you should:  Be screened for bone loss.  Take a calcium or vitamin D supplement to lower your risk of fractures.  Be given hormone replacement therapy (HRT) to treat symptoms of menopause. Follow these instructions at home: Lifestyle  Do not use any products that contain nicotine or tobacco, such as cigarettes, e-cigarettes, and chewing tobacco. If you need help quitting, ask your health care provider.  Do not use street drugs.  Do not share needles.  Ask your health care provider for help if you need support or information about quitting drugs. Alcohol use  Do not drink alcohol if: ? Your health care provider tells you not to drink. ? You are pregnant, may be pregnant, or are planning to become pregnant.  If you drink alcohol: ? Limit how much you use to 0-1 drink a day. ? Limit intake if you are breastfeeding.  Be aware of how much alcohol is in your drink. In the [place] one drink equals one 12 oz bottle of beer (355 mL), one 5 oz glass of wine (148 mL), or one 1 oz glass of hard liquor (44 mL). General instructions  Schedule regular health, dental, and eye exams.  Stay current with your vaccines.  Tell your health care provider if: ? You often feel depressed. ? You have ever been abused or do not feel safe at home. Summary  Adopting a healthy lifestyle and getting  preventive care are important in promoting health and wellness.  Follow your health care provider's instructions about healthy diet, exercising, and getting tested or screened for diseases.  Follow your health care provider's instructions on monitoring your cholesterol and blood pressure. This information is not intended to replace advice given to you by your health care provider. Make sure you discuss any questions you have with your health care provider. Document Revised: 08/25/2018 Document Reviewed: 08/25/2018 Elsevier Patient Education  2020 [employer].

## 2020-08-16 ENCOUNTER — Other Ambulatory Visit: Payer: Self-pay | Admitting: Nurse Practitioner

## 2020-08-16 DIAGNOSIS — N281 Cyst of kidney, acquired: Secondary | ICD-10-CM

## 2020-08-16 DIAGNOSIS — R93429 Abnormal radiologic findings on diagnostic imaging of unspecified kidney: Secondary | ICD-10-CM

## 2020-08-16 LAB — LIPID PANEL
Chol/HDL Ratio: 3.4 ratio (ref 0.0–4.4)
Cholesterol, Total: 173 mg/dL (ref 100–199)
HDL: 51 mg/dL (ref >39)
LDL Chol Calc (NIH): 108 mg/dL — ABNORMAL HIGH (ref 0–99)
Triglycerides: 71 mg/dL (ref 0–149)
VLDL Cholesterol Cal: 14 mg/dL (ref 5–40)

## 2020-08-16 LAB — CMP14+EGFR
ALT: 17 IU/L (ref 0–32)
AST: 48 IU/L — ABNORMAL HIGH (ref 0–40)
Albumin/Globulin Ratio: 1.4 (ref 1.2–2.2)
Albumin: 4.5 g/dL (ref 3.8–4.9)
Alkaline Phosphatase: 85 IU/L (ref 44–121)
BUN/Creatinine Ratio: 10 (ref 9–23)
BUN: 12 mg/dL (ref 6–24)
Bilirubin Total: 1.9 mg/dL — ABNORMAL HIGH (ref 0.0–1.2)
CO2: 24 mmol/L (ref 20–29)
Calcium: 10.4 mg/dL — ABNORMAL HIGH (ref 8.7–10.2)
Chloride: 101 mmol/L (ref 96–106)
Creatinine, Ser: 1.16 mg/dL — ABNORMAL HIGH (ref 0.57–1.00)
GFR calc Af Amer: 60 mL/min/{1.73_m2} (ref >59)
GFR calc non Af Amer: 52 mL/min/{1.73_m2} — ABNORMAL LOW (ref >59)
Globulin, Total: 3.2 g/dL (ref 1.5–4.5)
Glucose: 86 mg/dL (ref 65–99)
Potassium: 3.8 mmol/L (ref 3.5–5.2)
Sodium: 138 mmol/L (ref 134–144)
Total Protein: 7.7 g/dL (ref 6.0–8.5)

## 2020-08-16 LAB — HEMOGLOBIN A1C
Est. average glucose Bld gHb Est-mCnc: 97 mg/dL
Hgb A1c MFr Bld: 5 % (ref 4.8–5.6)

## 2020-08-16 LAB — CBC
Hematocrit: 40.5 % (ref 34.0–46.6)
Hemoglobin: 13.6 g/dL (ref 11.1–15.9)
MCH: 28.8 pg (ref 26.6–33.0)
MCHC: 33.6 g/dL (ref 31.5–35.7)
MCV: 86 fL (ref 79–97)
Platelets: 287 10*3/uL (ref 150–450)
RBC: 4.73 x10E6/uL (ref 3.77–5.28)
RDW: 12.2 % (ref 11.7–15.4)
WBC: 4 10*3/uL (ref 3.4–10.8)

## 2020-08-16 LAB — HIV ANTIBODY (ROUTINE TESTING W REFLEX): HIV Screen 4th Generation wRfx: NONREACTIVE

## 2020-08-16 NOTE — Progress Notes (Signed)
Thank you :)

## 2020-08-16 NOTE — Progress Notes (Signed)
The MRI order has been sent and she needs to call the urologist Dr. Karsten Ro at [phone] to make an appt and follow up with them because the referrel was already sent in [place].

## 2020-08-16 NOTE — Progress Notes (Signed)
The MRI order was sent. And she needs to call the urologist Dr. Karsten Ro at [phone] to make an appointment and follow up with them.

## 2020-08-17 LAB — URINE CULTURE

## 2020-08-28 ENCOUNTER — Ambulatory Visit

## 2020-08-28 ENCOUNTER — Other Ambulatory Visit: Payer: Self-pay

## 2020-08-28 VITALS — BP 140/78 | HR 77 | Temp 98.2°F | Ht 68.5 in | Wt 204.0 lb

## 2020-08-28 DIAGNOSIS — I10 Essential (primary) hypertension: Secondary | ICD-10-CM

## 2020-08-28 NOTE — Progress Notes (Signed)
Patient is here today for blood pressure check. She was started on amlodipine at her last visit.   BP Readings from Last 3 Encounters:  08/28/20 140/78  08/15/20 (!) 158/90  06/05/20 (!) 144/88

## 2020-08-29 ENCOUNTER — Ambulatory Visit: Admitting: Nurse Practitioner

## 2020-09-11 ENCOUNTER — Other Ambulatory Visit

## 2020-09-13 ENCOUNTER — Other Ambulatory Visit: Payer: Self-pay | Admitting: Nurse Practitioner

## 2020-09-13 DIAGNOSIS — I10 Essential (primary) hypertension: Secondary | ICD-10-CM

## 2020-09-19 ENCOUNTER — Ambulatory Visit (INDEPENDENT_AMBULATORY_CARE_PROVIDER_SITE_OTHER): Admitting: Nurse Practitioner

## 2020-09-19 ENCOUNTER — Other Ambulatory Visit: Payer: Self-pay

## 2020-09-19 ENCOUNTER — Encounter: Payer: Self-pay | Admitting: Nurse Practitioner

## 2020-09-19 VITALS — BP 136/80 | HR 90 | Temp 98.2°F | Ht 69.2 in | Wt 203.8 lb

## 2020-09-19 DIAGNOSIS — Z6829 Body mass index (BMI) 29.0-29.9, adult: Secondary | ICD-10-CM

## 2020-09-19 DIAGNOSIS — I10 Essential (primary) hypertension: Secondary | ICD-10-CM

## 2020-09-19 DIAGNOSIS — E663 Overweight: Secondary | ICD-10-CM

## 2020-09-19 NOTE — Patient Instructions (Signed)

## 2020-09-19 NOTE — Progress Notes (Signed)
I,Tianna Badgett,acting as a Education administrator for [employer], NP.,have documented all relevant documentation on the behalf of [employer], NP,as directed by  Bary Castilla, NP while in the presence of Bary Castilla, NP.  This visit occurred during the SARS-CoV-2 public health emergency.  Safety protocols were in place, including screening questions prior to the visit, additional usage of staff PPE, and extensive cleaning of exam room while observing appropriate contact time as indicated for disinfecting solutions.  Subjective:     Patient ID: Nicole Hunt , female    DOB: [birth year] , 60 y.o.   MRN: [id number]   Chief Complaint  Patient presents with  . Hypertension    HPI  Patient is here for her blood pressure check. She has since increased her cardio at the gym. She has been compliant with medications. She has no concerns at this time. She is to get her MRI which the order was written for her at her last visit due to a prior abnormal ultra sound of her kidneys. Still waiting on [facility] to schedule herfor it.   BP Readings from Last 3 Encounters: 09/19/20 : 136/80 08/28/20 : 140/78 08/15/20 : (!) 158/90  Diet: She drinks oat milk. No fried food. Limiting meat. She is watching what she eats. She is avoiding dairy products.  Exercise: She has started working out a lot more. She walks 3 miles with her dog and also if not walking the dog she runs 3 miles. She also lifts weights.    Hypertension This is a chronic problem. The current episode started more than 1 month ago. The problem has been gradually improving since onset. The problem is controlled. Pertinent negatives include no chest pain, palpitations or shortness of breath. Past treatments include calcium channel blockers and diuretics. The current treatment provides moderate improvement. There are no compliance problems.      Past Medical History:  Diagnosis Date  . Arthritis   . Heart murmur       Family History  Problem Relation Age of Onset  . Cancer Mother   . Hypothyroidism Mother   . Arthritis Mother   . Stroke Father   . Hypertension Father   . Hypertension Sister   . Hypertension Brother   . Healthy Brother   . Congestive Heart Failure Maternal Grandmother      Current Outpatient Medications:  .  amLODipine (NORVASC) 5 MG tablet, Take 1 tablet (5 mg total) by mouth daily., Disp: 30 tablet, Rfl: 11 .  hydrochlorothiazide (HYDRODIURIL) 12.5 MG tablet, TAKE 1 TABLET(12.5 MG) BY MOUTH DAILY, Disp: 90 tablet, Rfl: 0 .  Magnesium 250 MG TABS, Take 1 tablet (250 mg total) by mouth every evening. Take with evening meal., Disp: 90 tablet, Rfl: 1   No Known Allergies   Review of Systems  Constitutional: Negative.  Negative for appetite change, fatigue and fever.  Respiratory: Negative.  Negative for shortness of breath and stridor.   Cardiovascular: Negative.  Negative for chest pain and palpitations.  Gastrointestinal: Negative.   Neurological: Negative.      Today's Vitals   09/19/20 0901  BP: 136/80  Pulse: 90  Temp: 98.2 F (36.8 C)  TempSrc: Oral  Weight: 203 lb 12.8 oz (92.4 kg)  Height: 5' 9.2" (1.758 m)   Body mass index is 29.92 kg/m.  Wt Readings from Last 3 Encounters:  09/19/20 203 lb 12.8 oz (92.4 kg)  08/28/20 204 lb (92.5 kg)  08/15/20 205 lb 12.8 oz (93.4 kg)  Objective:  Physical Exam Constitutional:      Appearance: Normal appearance. She is obese.  Cardiovascular:     Rate and Rhythm: Normal rate and regular rhythm.     Pulses: Normal pulses.  Pulmonary:     Effort: Pulmonary effort is normal. No respiratory distress.     Breath sounds: Normal breath sounds. No wheezing.  Neurological:     Mental Status: She is alert.     Assessment And Plan:     1. Essential hypertension -Controlled, patient compliance with medication, diet and exercise.  -Educated patient about exercising 4-5 times a week for at least 30-45 min daily.   -Advised patient to continue to focus on lifestyle modification with her diet in avoiding processed and limiting salt intake. Eat more lean meats and less red meats.  -Continue current meds   2. Overweight with body mass index (BMI) of 29 to 29.9 in adult -Patient will continue to exercise by walking her dog and running. She will also continue to eat healthier and continue her lifestyle modification.  -She is encouraged to strive for BMI less than 26 to decrease cardiac risk. Advised to aim for at least 150 minutes of exercise per week.   Patient was given opportunity to ask questions. Patient verbalized understanding of the plan and was able to repeat key elements of the plan. All questions were answered to their satisfaction.  Charlesetta Ivory, NP   I, Charlesetta Ivory, NP, have reviewed all documentation for this visit. The documentation on 09/19/20 for the exam, diagnosis, procedures, and orders are all accurate and complete.  THE PATIENT IS ENCOURAGED TO PRACTICE SOCIAL DISTANCING DUE TO THE COVID-19 PANDEMIC.

## 2020-10-10 ENCOUNTER — Other Ambulatory Visit (HOSPITAL_COMMUNITY): Payer: Self-pay | Admitting: Urology

## 2020-10-10 DIAGNOSIS — D49512 Neoplasm of unspecified behavior of left kidney: Secondary | ICD-10-CM

## 2020-10-18 ENCOUNTER — Ambulatory Visit (HOSPITAL_COMMUNITY)
Admission: RE | Admit: 2020-10-18 | Discharge: 2020-10-18 | Disposition: A | Discharge status: Home / Self Care | Source: Ambulatory Visit | Attending: Urology | Admitting: Urology

## 2020-10-18 ENCOUNTER — Other Ambulatory Visit: Payer: Self-pay

## 2020-10-18 DIAGNOSIS — D49512 Neoplasm of unspecified behavior of left kidney: Secondary | ICD-10-CM | POA: Diagnosis present

## 2020-10-18 IMAGING — MR MR ABDOMEN WO/W CM
19 series · 48 of 48 positions shown · IV contrast (gadavist)
Comparison: Ultrasound [DATE]

CLINICAL DATA: Follow-up suspected left renal neoplasm.

EXAM:
MRI ABDOMEN WITHOUT AND WITH CONTRAST
TECHNIQUE: Multiplanar multisequence MR imaging of the abdomen was performed
both before and after the administration of intravenous contrast.
CONTRAST:  9mL GADAVIST GADOBUTROL 1 MMOL/ML IV SOLN

[Series 2: T2 · coronal · 6.0mm · 1.56mm/px · 1 of 34 slices shown (1 of 2)]
[im 1/34]
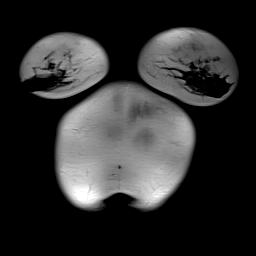

[Series 3: T2 fat-sat · axial · 6.0mm · 1.25mm/px · 1 of 36 slices shown (1 of 2)]
[im 1/36]
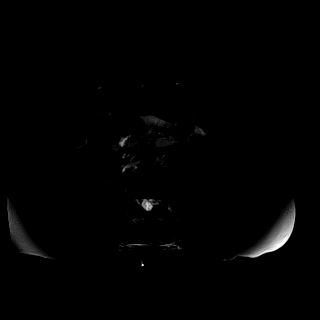

[Series 5: DWI · axial · 6.0mm · 1.49mm/px · z∈[-198,+83]mm · 2 of 80 slices shown (1 of 2)]
[im 1/80]
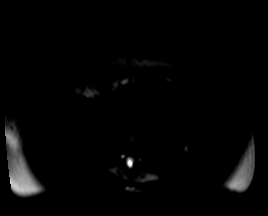
[im 80/80]
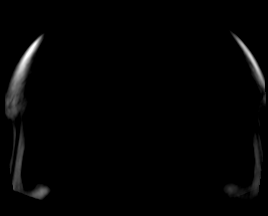

[Series 6: DWI · axial · 6.0mm · 1.49mm/px · 1 of 40 slices shown (2 of 2)]
[im 1/40]
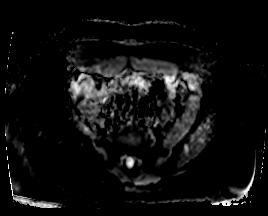

[Series 7: T2 fat-sat · axial · 6.0mm · 1.25mm/px · z∈[-188,+93]mm · 2 of 40 slices shown (2 of 2)]
[im 1/40]
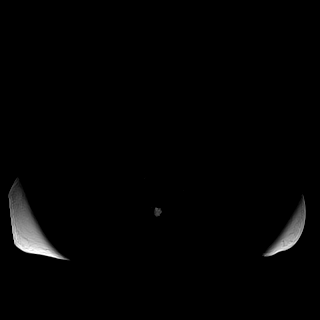
[im 40/40]
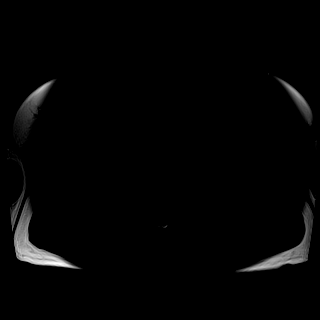

[Series 8: T1 · axial · 3.0mm · 1.33mm/px · z∈[-192,+93]mm · 4 of 96 slices shown (1 of 2)]
[im 1/96]
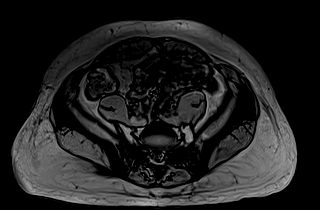
[im 32/96]
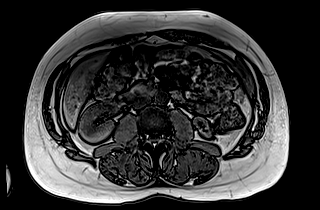
[im 64/96]
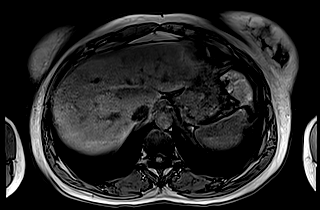
[im 96/96]
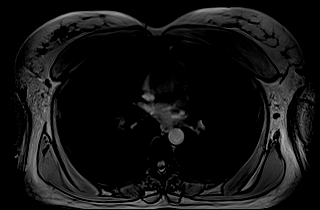

[Series 9: T1 · axial · 3.0mm · 1.33mm/px · z∈[-192,+93]mm · 4 of 96 slices shown (2 of 2)]
[im 1/96]
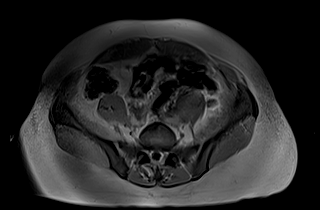
[im 32/96]
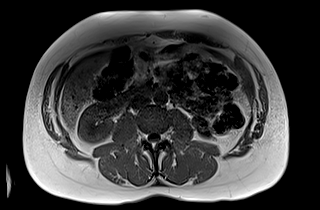
[im 64/96]
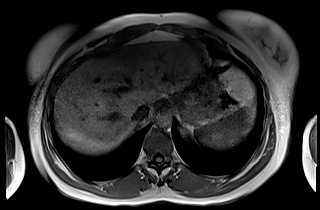
[im 96/96]
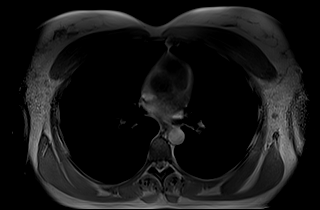

[Series 10: bSSFP · axial · 5.0mm · 0.84mm/px · z∈[-179,+91]mm · 2 of 50 slices shown]
[im 1/50]
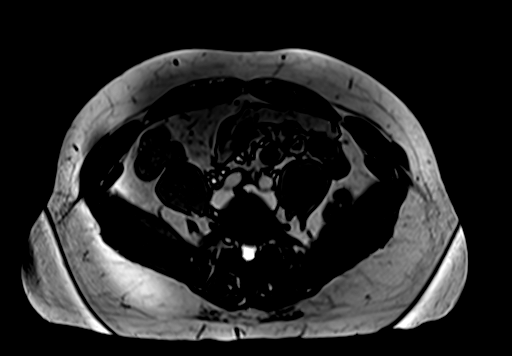
[im 50/50]
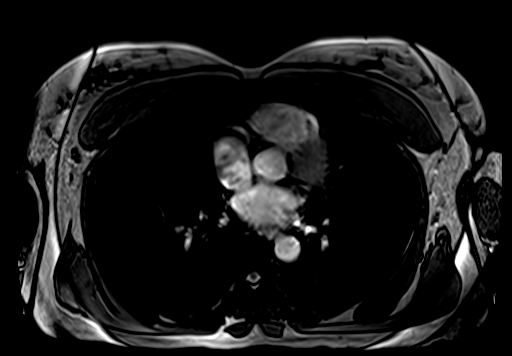

[Series 14: T1 dynamic · axial · 3.4mm · 1.33mm/px · z∈[-180,+88]mm · 3 of 80 slices shown (1 of 6)]
[im 1/80]
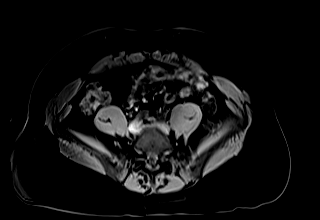
[im 40/80]
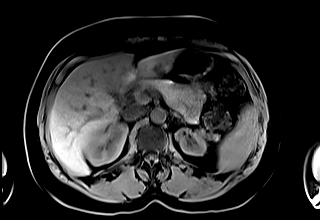
[im 80/80]
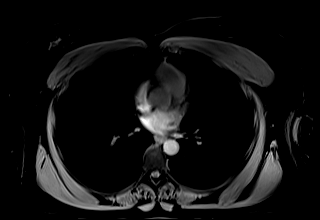

[Series 17: T1 dynamic · axial · 3.4mm · 1.33mm/px · z∈[-180,+88]mm · 3 of 80 slices shown (2 of 6)]
[im 1/80]
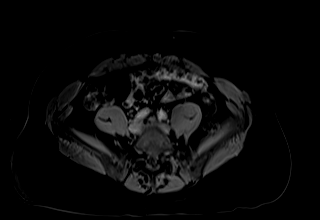
[im 40/80]
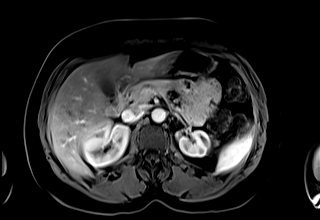
[im 80/80]
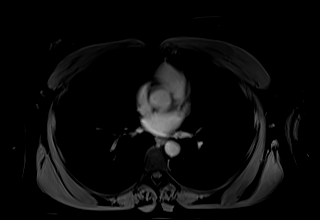

[Series 19: T1 dynamic · axial · 3.4mm · 1.33mm/px · z∈[-180,+88]mm · 3 of 80 slices shown (3 of 6)]
[im 1/80]
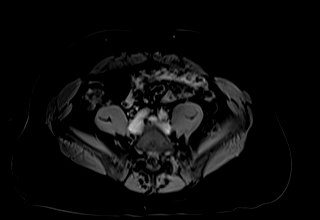
[im 40/80]
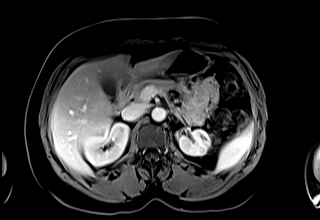
[im 80/80]
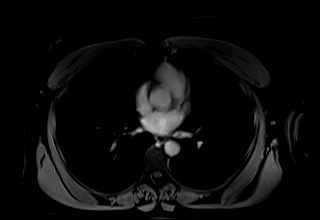

[Series 21: T1 dynamic · axial · 3.4mm · 1.33mm/px · z∈[-180,+88]mm · 3 of 80 slices shown (4 of 6)]
[im 1/80]
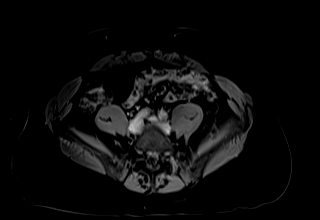
[im 40/80]
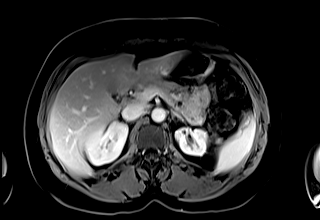
[im 80/80]
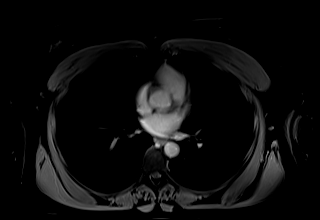

[Series 23: T1 dynamic · coronal · 5.0mm · 1.25mm/px · 2 of 48 slices shown (5 of 6)]
[im 1/48]
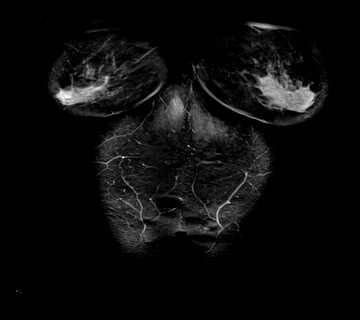
[im 48/48]
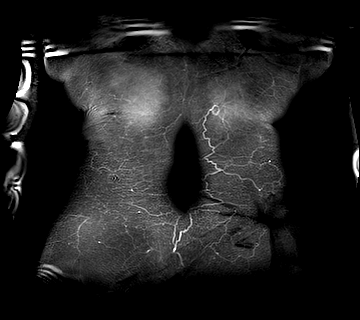

[Series 24: T2 · axial · 6.0mm · 1.56mm/px · z∈[-187,+94]mm · 2 of 40 slices shown (2 of 2)]
[im 1/40]
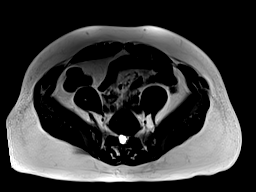
[im 40/40]
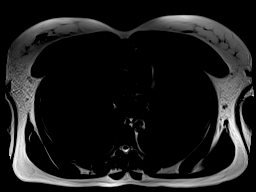

[Series 26: T1 dynamic · axial · 3.4mm · 1.33mm/px · z∈[-180,+88]mm · 3 of 80 slices shown (6 of 6)]
[im 1/80]
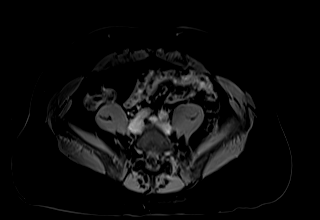
[im 40/80]
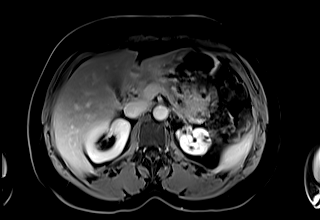
[im 80/80]
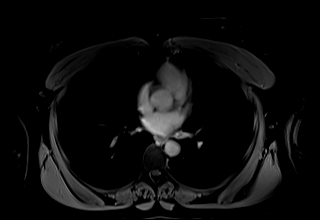

[Series 100: sub_20 sec · axial · 3.4mm · 1.33mm/px · z∈[-180,+88]mm · 3 of 80 slices shown]
[im 1/80]
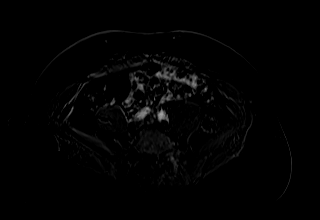
[im 40/80]
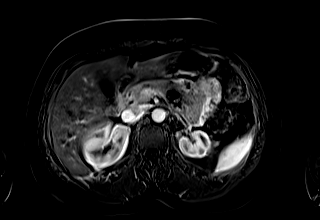
[im 80/80]
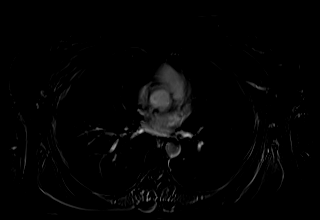

[Series 101: sub_45 sec · axial · 3.4mm · 1.33mm/px · z∈[-180,+88]mm · 3 of 80 slices shown]
[im 1/80]
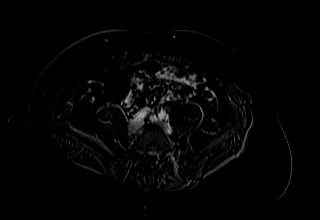
[im 40/80]
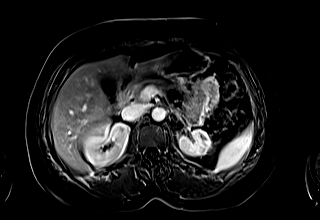
[im 80/80]
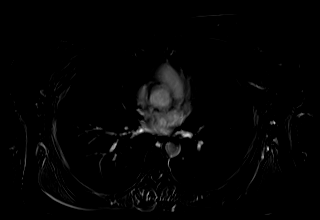

[Series 102: sub_90 sec · axial · 3.4mm · 1.33mm/px · z∈[-180,+88]mm · 3 of 80 slices shown]
[im 1/80]
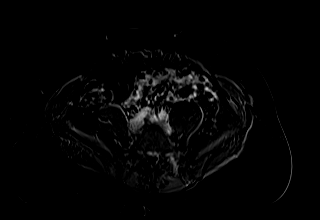
[im 40/80]
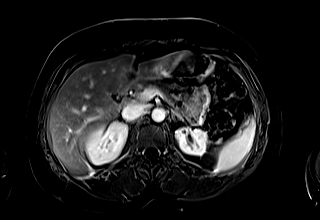
[im 80/80]
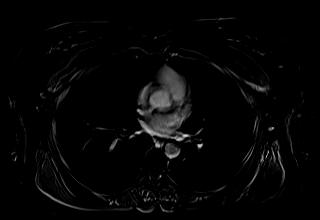

[Series 103: sub_delay · axial · 3.4mm · 1.33mm/px · z∈[-180,+88]mm · 3 of 80 slices shown]
[im 1/80]
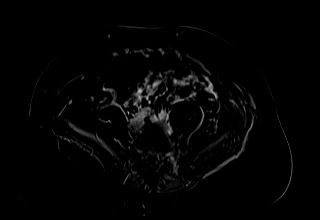
[im 40/80]
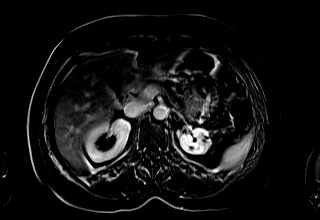
[im 80/80]
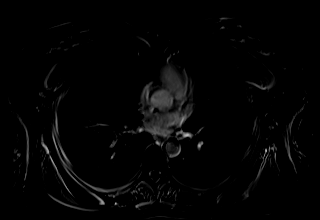

[48 of 48 positions shown; findings below may reference images not displayed]

FINDINGS: Lower chest: No acute findings.

Hepatobiliary: No mass or other parenchymal abnormality identified.
The gallbladder appears normal. No gallstones, gallbladder wall
thickening or biliary ductal dilatation.

Pancreas: No mass, inflammatory changes, or other parenchymal
abnormality identified.

Spleen:  Within normal limits in size and appearance.

Adrenals/Urinary Tract: The adrenal glands appear within normal
limits. The right kidney is unremarkable. No mass or hydronephrosis.
The area is none left renal atrophy with multifocal areas of
cortical scarring. There are 2 small cyst identified within the
upper pole of the left kidney which measure up to 9 mm. Within the
inferior pole of the left kidney there is a T2 hypointense and
mildly T1 hyperintense lesion within the inferior pole this measures
9 mm, image [DATE]. Technically too small to reliably characterize but
likely represents a small hemorrhagic or proteinaceous cyst. No
suspicious enhancing kidney lesion identified at this time.

Stomach/Bowel: Visualized portions within the abdomen are
unremarkable.

Vascular/Lymphatic: No pathologically enlarged lymph nodes
identified. No abdominal aortic aneurysm demonstrated.

Other: There is a small amount of fluid identified within the left
upper quadrant of the abdomen between the posterior wall of stomach
and spleen, nonspecific.

Musculoskeletal: No suspicious bone lesions identified.
IMPRESSION: 1. Left renal atrophy with multifocal areas of cortical scarring.
There are 2 small cysts within the upper pole of the left kidney.
Within the inferior pole of the left kidney there is a 9 mm T2
hypointense and mildly T1 hyperintense lesion within the inferior
pole of the left kidney. This is technically too small to reliably
characterize but likely represents a small hemorrhagic or
proteinaceous cyst. Consider follow-up imaging with repeat MR in
6-12 12 months without and with contrast material.

## 2020-10-18 MED ORDER — GADOBUTROL 1 MMOL/ML IV SOLN
9.0000 mL | Freq: Once | INTRAVENOUS | Status: AC | PRN
  Administered 2020-10-18: 9 mL via INTRAVENOUS

## 2020-11-19 ENCOUNTER — Ambulatory Visit: Admitting: Nurse Practitioner

## 2020-12-18 ENCOUNTER — Ambulatory Visit: Admitting: Nurse Practitioner

## 2020-12-18 ENCOUNTER — Other Ambulatory Visit: Payer: Self-pay

## 2020-12-18 ENCOUNTER — Encounter: Payer: Self-pay | Admitting: Nurse Practitioner

## 2020-12-18 VITALS — BP 132/82 | HR 92 | Temp 97.9°F | Ht 68.6 in | Wt 214.2 lb

## 2020-12-18 DIAGNOSIS — I10 Essential (primary) hypertension: Secondary | ICD-10-CM

## 2020-12-18 DIAGNOSIS — Z87828 Personal history of other (healed) physical injury and trauma: Secondary | ICD-10-CM

## 2020-12-18 DIAGNOSIS — M25562 Pain in left knee: Secondary | ICD-10-CM

## 2020-12-18 DIAGNOSIS — N289 Disorder of kidney and ureter, unspecified: Secondary | ICD-10-CM | POA: Diagnosis not present

## 2020-12-18 DIAGNOSIS — G8929 Other chronic pain: Secondary | ICD-10-CM

## 2020-12-18 DIAGNOSIS — E7800 Pure hypercholesterolemia, unspecified: Secondary | ICD-10-CM

## 2020-12-18 NOTE — Progress Notes (Signed)
I,Yamilka Roman [employer] as a Education administrator for [employer], FNP.,have documented all relevant documentation on the behalf of Minette Brine, FNP,as directed by  Minette Brine, FNP while in the presence of Minette Brine, [place]. This visit occurred during the SARS-CoV-2 public health emergency.  Safety protocols were in place, including screening questions prior to the visit, additional usage of staff PPE, and extensive cleaning of exam room while observing appropriate contact time as indicated for disinfecting solutions.  Subjective:     Patient ID: Nicole Hunt , female    DOB: December 04, 1960 , 60 y.o.   MRN: [id number]   Chief Complaint  Patient presents with  . Hypertension    HPI  Patient is here for her blood pressure check.  She is tolerating medications well (amlodipine).  She is back in the gym.  She has left knee pain when she tries to do weights and jog so it limits her from working out. She will have swelling to her left knee.   Wt Readings from Last 3 Encounters: 12/18/20 : 214 lb 3.2 oz (97.2 kg) 09/19/20 : 203 lb 12.8 oz (92.4 kg) 08/28/20 : 204 lb (92.5 kg)  BP Readings from Last 3 Encounters: 12/18/20 : 132/82 09/19/20 : 136/80 08/28/20 : 140/78  Hypertension This is a chronic problem. The current episode started more than 1 month ago. The problem has been gradually improving since onset. The problem is controlled. Pertinent negatives include no chest pain, palpitations or shortness of breath. There are no associated agents to hypertension. Past treatments include calcium channel blockers and diuretics. The current treatment provides moderate improvement. There are no compliance problems.  There is no history of angina.     Past Medical History:  Diagnosis Date  . Arthritis   . Heart murmur      Family History  Problem Relation Age of Onset  . Cancer Mother   . Hypothyroidism Mother   . Arthritis Mother   . Stroke Father   . Hypertension Father   . Hypertension  Sister   . Hypertension Brother   . Healthy Brother   . Congestive Heart Failure Maternal Grandmother      Current Outpatient Medications:  .  amLODipine (NORVASC) 5 MG tablet, Take 1 tablet (5 mg total) by mouth daily., Disp: 30 tablet, Rfl: 11 .  Magnesium 250 MG TABS, Take 1 tablet (250 mg total) by mouth every evening. Take with evening meal., Disp: 90 tablet, Rfl: 1 .  hydrochlorothiazide (HYDRODIURIL) 12.5 MG tablet, TAKE 1 TABLET(12.5 MG) BY MOUTH DAILY, Disp: 90 tablet, Rfl: 0   No Known Allergies   Review of Systems  Constitutional: Negative.  Negative for fatigue.  Respiratory: Negative.  Negative for shortness of breath and wheezing.   Cardiovascular: Negative for chest pain, palpitations and leg swelling.  Musculoskeletal:       Left knee swelling posterior.  Sometimes has pain when walking the dog.   Psychiatric/Behavioral: Negative.      Today's Vitals   12/18/20 1025  BP: 132/82  Pulse: 92  Temp: 97.9 F (36.6 C)  TempSrc: Oral  Weight: 214 lb 3.2 oz (97.2 kg)  Height: 5' 8.6" (1.742 m)  PainSc: 0-No pain   Body mass index is 32 kg/m.   Objective:  Physical Exam Constitutional:      General: She is not in acute distress.    Appearance: Normal appearance.  Cardiovascular:     Pulses: Normal pulses.     Heart sounds: Normal heart sounds. No murmur  heard.   Pulmonary:     Effort: Pulmonary effort is normal. No respiratory distress.     Breath sounds: Normal breath sounds.  Neurological:     General: No focal deficit present.     Mental Status: She is alert and oriented to person, place, and time.     Cranial Nerves: No cranial nerve deficit.  Psychiatric:        Mood and Affect: Mood normal.        Behavior: Behavior normal.        Thought Content: Thought content normal.        Judgment: Judgment normal.         Assessment And Plan:     1. Essential hypertension . B/P is fairly controlled.  . CMP ordered to check renal function.  . The  importance of regular exercise and dietary modification was stressed to the patient.  . Stressed importance of losing ten percent of her body weight to help with B/P control.   2. Elevated cholesterol  Chronic, controlled  No current medications - Lipid panel - BMP8+eGFR  3. Function kidney decreased  Will recheck kidney functions   She is encouraged to increase her water intake and to avoid NSAIDs - BMP8+eGFR  4. Chronic pain of left knee  I will refer to orthopedics for further evaluation  5. History of tear of meniscus of knee joint     Patient was given opportunity to ask questions. Patient verbalized understanding of the plan and was able to repeat key elements of the plan. All questions were answered to their satisfaction.  Minette Brine, FNP   I, Minette Brine, FNP, have reviewed all documentation for this visit. The documentation on 01/10/21 for the exam, diagnosis, procedures, and orders are all accurate and complete.   IF YOU HAVE BEEN REFERRED TO A SPECIALIST, IT MAY TAKE 1-2 WEEKS TO SCHEDULE/PROCESS THE REFERRAL. IF YOU HAVE NOT HEARD FROM US/SPECIALIST IN TWO WEEKS, PLEASE GIVE [place] A CALL AT [phone] X [phone].   THE PATIENT IS ENCOURAGED TO PRACTICE SOCIAL DISTANCING DUE TO THE COVID-19 PANDEMIC.

## 2020-12-18 NOTE — Patient Instructions (Addendum)

## 2020-12-19 LAB — BMP8+EGFR
BUN/Creatinine Ratio: 11 (ref 9–23)
BUN: 15 mg/dL (ref 6–24)
CO2: 22 mmol/L (ref 20–29)
Calcium: 10.2 mg/dL (ref 8.7–10.2)
Chloride: 103 mmol/L (ref 96–106)
Creatinine, Ser: 1.36 mg/dL — ABNORMAL HIGH (ref 0.57–1.00)
Glucose: 96 mg/dL (ref 65–99)
Potassium: 4.9 mmol/L (ref 3.5–5.2)
Sodium: 140 mmol/L (ref 134–144)
eGFR: 45 mL/min/{1.73_m2} — ABNORMAL LOW (ref >59)

## 2020-12-19 LAB — LIPID PANEL
Chol/HDL Ratio: 3.9 ratio (ref 0.0–4.4)
Cholesterol, Total: 208 mg/dL — ABNORMAL HIGH (ref 100–199)
HDL: 54 mg/dL (ref >39)
LDL Chol Calc (NIH): 142 mg/dL — ABNORMAL HIGH (ref 0–99)
Triglycerides: 65 mg/dL (ref 0–149)
VLDL Cholesterol Cal: 12 mg/dL (ref 5–40)

## 2020-12-20 ENCOUNTER — Other Ambulatory Visit: Payer: Self-pay | Admitting: Nurse Practitioner

## 2020-12-20 DIAGNOSIS — I10 Essential (primary) hypertension: Secondary | ICD-10-CM

## 2021-01-10 ENCOUNTER — Telehealth: Payer: Self-pay

## 2021-01-10 NOTE — Telephone Encounter (Signed)
-----   Message from Minette Brine, [place] sent at 01/10/2021  9:01 AM EDT ----- Your cholesterol levels are slightly up, are you eating more fried and fatty foods? Your kidney functions remain sluggish, I would like for you to come in for additional labs.  As I was reviewing your chart I noticed you had knee pain would you like a referral to orthopedics?

## 2021-01-10 NOTE — Telephone Encounter (Signed)
I left a message with the pt's mother for the pt to please return the call or respond in mychart about her lab results.

## 2021-03-19 ENCOUNTER — Encounter: Payer: Self-pay | Admitting: Nurse Practitioner

## 2021-03-19 ENCOUNTER — Ambulatory Visit: Admitting: Nurse Practitioner

## 2021-03-19 ENCOUNTER — Other Ambulatory Visit: Payer: Self-pay

## 2021-03-19 VITALS — BP 122/80 | HR 81 | Temp 97.9°F | Ht 68.4 in | Wt 211.0 lb

## 2021-03-19 DIAGNOSIS — Z23 Encounter for immunization: Secondary | ICD-10-CM

## 2021-03-19 DIAGNOSIS — E7800 Pure hypercholesterolemia, unspecified: Secondary | ICD-10-CM

## 2021-03-19 DIAGNOSIS — E6609 Other obesity due to excess calories: Secondary | ICD-10-CM

## 2021-03-19 DIAGNOSIS — N289 Disorder of kidney and ureter, unspecified: Secondary | ICD-10-CM | POA: Diagnosis not present

## 2021-03-19 DIAGNOSIS — Z6831 Body mass index (BMI) 31.0-31.9, adult: Secondary | ICD-10-CM

## 2021-03-19 DIAGNOSIS — I10 Essential (primary) hypertension: Secondary | ICD-10-CM

## 2021-03-19 MED ORDER — SHINGRIX 50 MCG/0.5ML IM SUSR: 0.5000 mL | Freq: Once | INTRAMUSCULAR | 0 refills | Status: AC

## 2021-03-19 NOTE — Progress Notes (Signed)
I,Yamilka Roman [employer] as a Education administrator for [employer], FNP.,have documented all relevant documentation on the behalf of Minette Brine, FNP,as directed by  Minette Brine, FNP while in the presence of Minette Brine, [place].  This visit occurred during the SARS-CoV-2 public health emergency.  Safety protocols were in place, including screening questions prior to the visit, additional usage of staff PPE, and extensive cleaning of exam room while observing appropriate contact time as indicated for disinfecting solutions.  Subjective:     Patient ID: Nicole Hunt , female    DOB: 1961/02/12 , 60 y.o.   MRN: [id number]   Chief Complaint  Patient presents with   Hypertension    HPI  Patient is here for her blood pressure check.  She is exercising 5 days a week.   She is doing well with her left knee after having a steroid injection.   Wt Readings from Last 3 Encounters: 03/19/21 : 211 lb (95.7 kg) 12/18/20 : 214 lb 3.2 oz (97.2 kg) 09/19/20 : 203 lb 12.8 oz (92.4 kg)     Hypertension This is a chronic problem. The current episode started more than 1 month ago. The problem has been gradually improving since onset. The problem is controlled. Pertinent negatives include no chest pain, palpitations or shortness of breath. There are no associated agents to hypertension. Past treatments include calcium channel blockers and diuretics. The current treatment provides moderate improvement. There are no compliance problems.  There is no history of angina.    Past Medical History:  Diagnosis Date   Arthritis    Heart murmur      Family History  Problem Relation Age of Onset   Cancer Mother    Hypothyroidism Mother    Arthritis Mother    Stroke Father    Hypertension Father    Hypertension Sister    Hypertension Brother    Healthy Brother    Congestive Heart Failure Maternal Grandmother      Current Outpatient Medications:    amLODipine (NORVASC) 5 MG tablet, Take 1 tablet (5 mg  total) by mouth daily., Disp: 30 tablet, Rfl: 11   Magnesium 250 MG TABS, Take 1 tablet (250 mg total) by mouth every evening. Take with evening meal., Disp: 90 tablet, Rfl: 1   hydrochlorothiazide (HYDRODIURIL) 12.5 MG tablet, TAKE 1 TABLET(12.5 MG) BY MOUTH DAILY, Disp: 90 tablet, Rfl: 0   No Known Allergies   Review of Systems  Constitutional: Negative.  Negative for fatigue.  Respiratory: Negative.  Negative for shortness of breath and wheezing.   Cardiovascular:  Negative for chest pain, palpitations and leg swelling.  Psychiatric/Behavioral: Negative.      Today's Vitals   03/19/21 0941  BP: 122/80  Pulse: 81  Temp: 97.9 F (36.6 C)  Weight: 211 lb (95.7 kg)  Height: 5' 8.4" (1.737 m)  PainSc: 0-No pain   Body mass index is 31.71 kg/m.   Objective:  Physical Exam Vitals reviewed.  Constitutional:      General: She is not in acute distress.    Appearance: Normal appearance.  Cardiovascular:     Rate and Rhythm: Normal rate and regular rhythm.     Pulses: Normal pulses.     Heart sounds: Normal heart sounds. No murmur heard. Pulmonary:     Effort: Pulmonary effort is normal. No respiratory distress.     Breath sounds: Normal breath sounds. No wheezing.  Neurological:     General: No focal deficit present.     Mental Status: She  is alert and oriented to person, place, and time.     Cranial Nerves: No cranial nerve deficit.  Psychiatric:        Mood and Affect: Mood normal.        Behavior: Behavior normal.        Thought Content: Thought content normal.        Judgment: Judgment normal.        Assessment And Plan:     1. Essential hypertension Comments: Blood pressure is much better controlled - BMP8+eGFR  2. Elevated cholesterol Comments: stable, no current medications Encouraged to continue low fat diet - Lipid panel  3. Class 1 obesity due to excess calories with body mass index (BMI) of 31.0 to 31.9 in adult, unspecified whether serious comorbidity  present Chronic Discussed healthy diet and regular exercise options  Encouraged to exercise at least 150 minutes per week with 2 days of strength training Wt Readings from Last 3 Encounters:  03/19/21 211 lb (95.7 kg)  12/18/20 214 lb 3.2 oz (97.2 kg)  09/19/20 203 lb 12.8 oz (92.4 kg)    4. Encounter for immunization - Zoster Vaccine Adjuvanted [facility]) injection; Inject 0.5 mLs into the muscle once for 1 dose.  Dispense: 0.5 mL; Refill: 0    Patient was given opportunity to ask questions. Patient verbalized understanding of the plan and was able to repeat key elements of the plan. All questions were answered to their satisfaction.  Minette Brine, FNP   I, Minette Brine, FNP, have reviewed all documentation for this visit. The documentation on 03/19/21 for the exam, diagnosis, procedures, and orders are all accurate and complete.   IF YOU HAVE BEEN REFERRED TO A SPECIALIST, IT MAY TAKE 1-2 WEEKS TO SCHEDULE/PROCESS THE REFERRAL. IF YOU HAVE NOT HEARD FROM US/SPECIALIST IN TWO WEEKS, PLEASE GIVE [place] A CALL AT [phone] X [phone].   THE PATIENT IS ENCOURAGED TO PRACTICE SOCIAL DISTANCING DUE TO THE COVID-19 PANDEMIC.

## 2021-03-19 NOTE — Patient Instructions (Signed)

## 2021-03-20 ENCOUNTER — Other Ambulatory Visit: Payer: Self-pay | Admitting: Nurse Practitioner

## 2021-03-20 DIAGNOSIS — I10 Essential (primary) hypertension: Secondary | ICD-10-CM

## 2021-03-20 LAB — BMP8+EGFR
BUN/Creatinine Ratio: 14 (ref 9–23)
BUN: 18 mg/dL (ref 6–24)
CO2: 20 mmol/L (ref 20–29)
Calcium: 10.6 mg/dL — ABNORMAL HIGH (ref 8.7–10.2)
Chloride: 104 mmol/L (ref 96–106)
Creatinine, Ser: 1.33 mg/dL — ABNORMAL HIGH (ref 0.57–1.00)
Glucose: 90 mg/dL (ref 65–99)
Potassium: 4.8 mmol/L (ref 3.5–5.2)
Sodium: 141 mmol/L (ref 134–144)
eGFR: 47 mL/min/{1.73_m2} — ABNORMAL LOW (ref >59)

## 2021-03-20 LAB — LIPID PANEL
Chol/HDL Ratio: 3.1 ratio (ref 0.0–4.4)
Cholesterol, Total: 168 mg/dL (ref 100–199)
HDL: 54 mg/dL (ref >39)
LDL Chol Calc (NIH): 99 mg/dL (ref 0–99)
Triglycerides: 78 mg/dL (ref 0–149)
VLDL Cholesterol Cal: 15 mg/dL (ref 5–40)

## 2021-03-25 LAB — PROTEIN ELECTROPHORESIS
A/G Ratio: 1.2 (ref 0.7–1.7)
Albumin ELP: 4 g/dL (ref 2.9–4.4)
Alpha 1: 0.2 g/dL (ref 0.0–0.4)
Alpha 2: 0.7 g/dL (ref 0.4–1.0)
Beta: 1 g/dL (ref 0.7–1.3)
Gamma Globulin: 1.5 g/dL (ref 0.4–1.8)
Globulin, Total: 3.4 g/dL (ref 2.2–3.9)
Total Protein: 7.4 g/dL (ref 6.0–8.5)

## 2021-03-25 LAB — PHOSPHORUS: Phosphorus: 2.7 mg/dL — ABNORMAL LOW (ref 3.0–4.3)

## 2021-03-25 LAB — SPECIMEN STATUS REPORT

## 2021-03-26 ENCOUNTER — Other Ambulatory Visit: Payer: Self-pay | Admitting: Nurse Practitioner

## 2021-03-26 DIAGNOSIS — N289 Disorder of kidney and ureter, unspecified: Secondary | ICD-10-CM

## 2021-05-13 ENCOUNTER — Other Ambulatory Visit: Payer: Self-pay | Admitting: *Deleted

## 2021-05-13 ENCOUNTER — Other Ambulatory Visit: Payer: Self-pay | Admitting: Nurse Practitioner

## 2021-05-13 DIAGNOSIS — Z1231 Encounter for screening mammogram for malignant neoplasm of breast: Secondary | ICD-10-CM

## 2021-05-16 ENCOUNTER — Ambulatory Visit
Admission: RE | Admit: 2021-05-16 | Discharge: 2021-05-16 | Disposition: A | Discharge status: Home / Self Care | Payer: TRICARE For Life (TFL) | Source: Ambulatory Visit | Attending: Nurse Practitioner | Admitting: Nurse Practitioner

## 2021-05-16 ENCOUNTER — Other Ambulatory Visit: Payer: Self-pay

## 2021-05-16 DIAGNOSIS — Z1231 Encounter for screening mammogram for malignant neoplasm of breast: Secondary | ICD-10-CM

## 2021-05-24 ENCOUNTER — Telehealth: Payer: Self-pay

## 2021-05-24 NOTE — Telephone Encounter (Signed)
Please call patient to ask if she has mammogram scheduled   I left pt vm to call the office [facility]

## 2021-05-31 ENCOUNTER — Ambulatory Visit
Admission: RE | Admit: 2021-05-31 | Discharge: 2021-05-31 | Disposition: A | Discharge status: Home / Self Care | Payer: TRICARE For Life (TFL) | Source: Ambulatory Visit | Attending: Nurse Practitioner | Admitting: Nurse Practitioner

## 2021-05-31 ENCOUNTER — Other Ambulatory Visit: Payer: Self-pay

## 2021-05-31 IMAGING — MG MM DIGITAL SCREENING BILAT W/ TOMO AND CAD
6 of 10 series · 6 of 30 positions shown · non-contrast
Comparison: Previous exam(s).

CLINICAL DATA: Screening.

EXAM:
DIGITAL SCREENING BILATERAL MAMMOGRAM WITH TOMOSYNTHESIS AND CAD
TECHNIQUE: Bilateral screening digital craniocaudal and mediolateral oblique
mammograms were obtained. Bilateral screening digital breast
tomosynthesis was performed. The images were evaluated with
computer-aided detection.

[R MLO synth-2D]
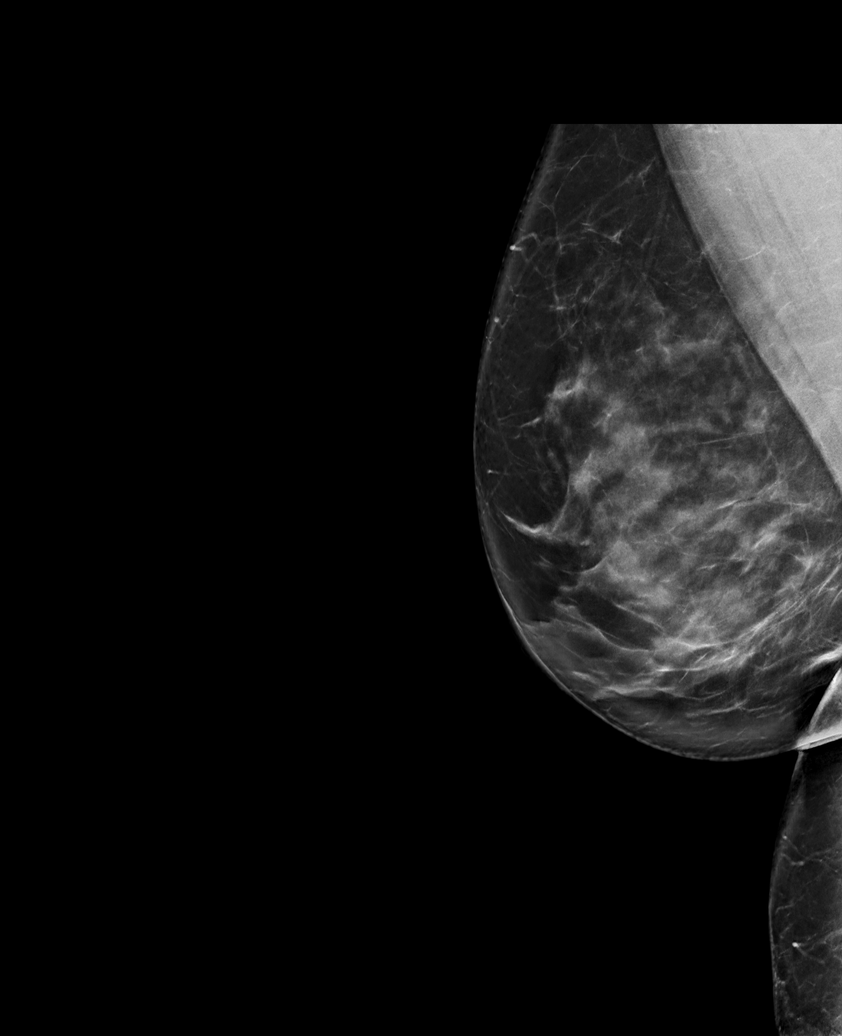

[L CC synth-2D (1 of 2)]
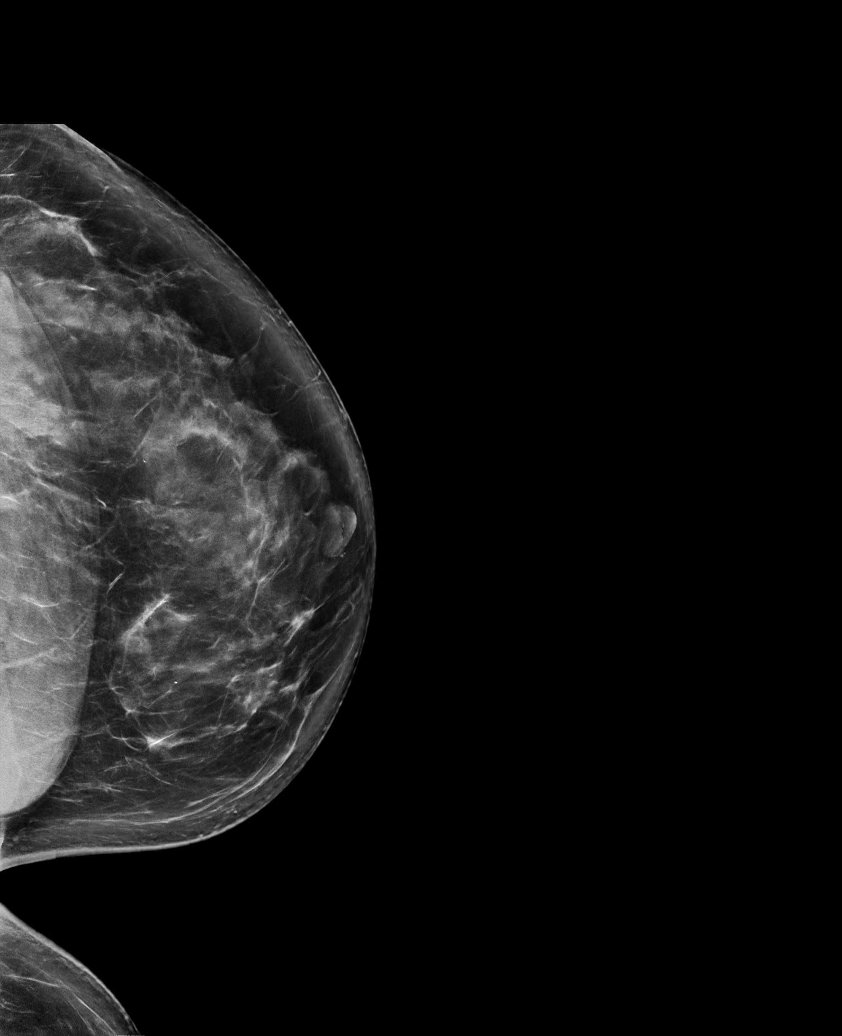

[R CC synth-2D]
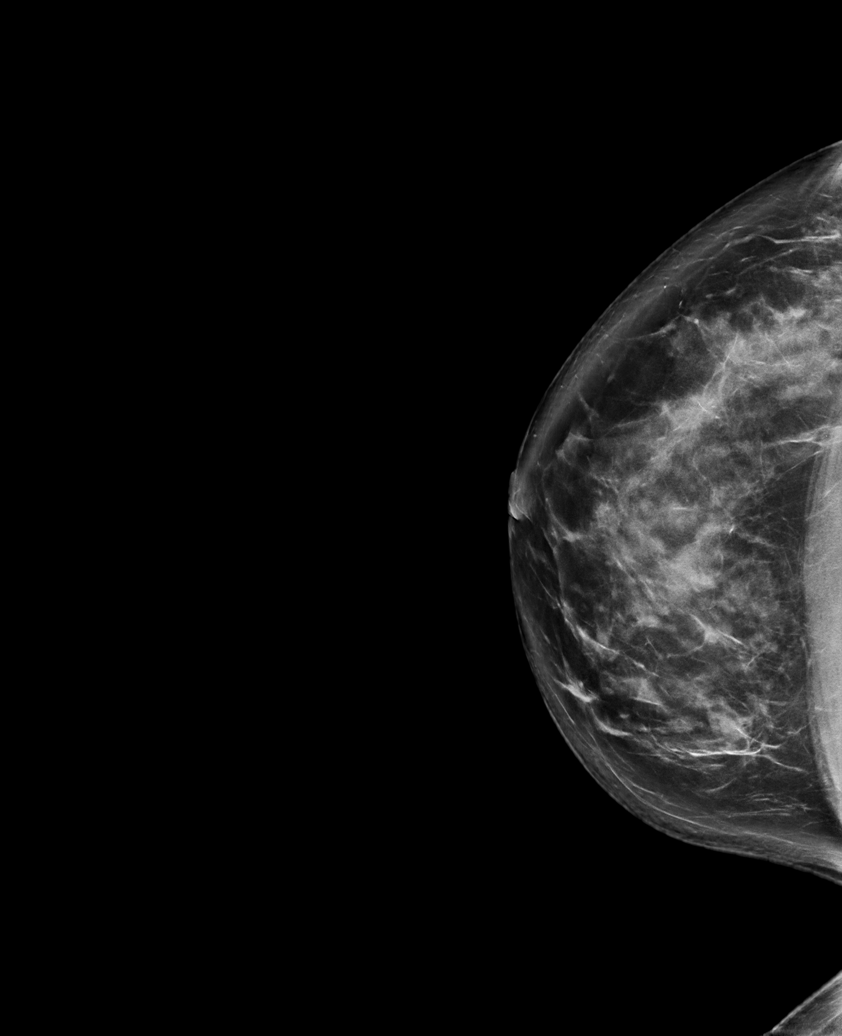

[L CC synth-2D (2 of 2)]
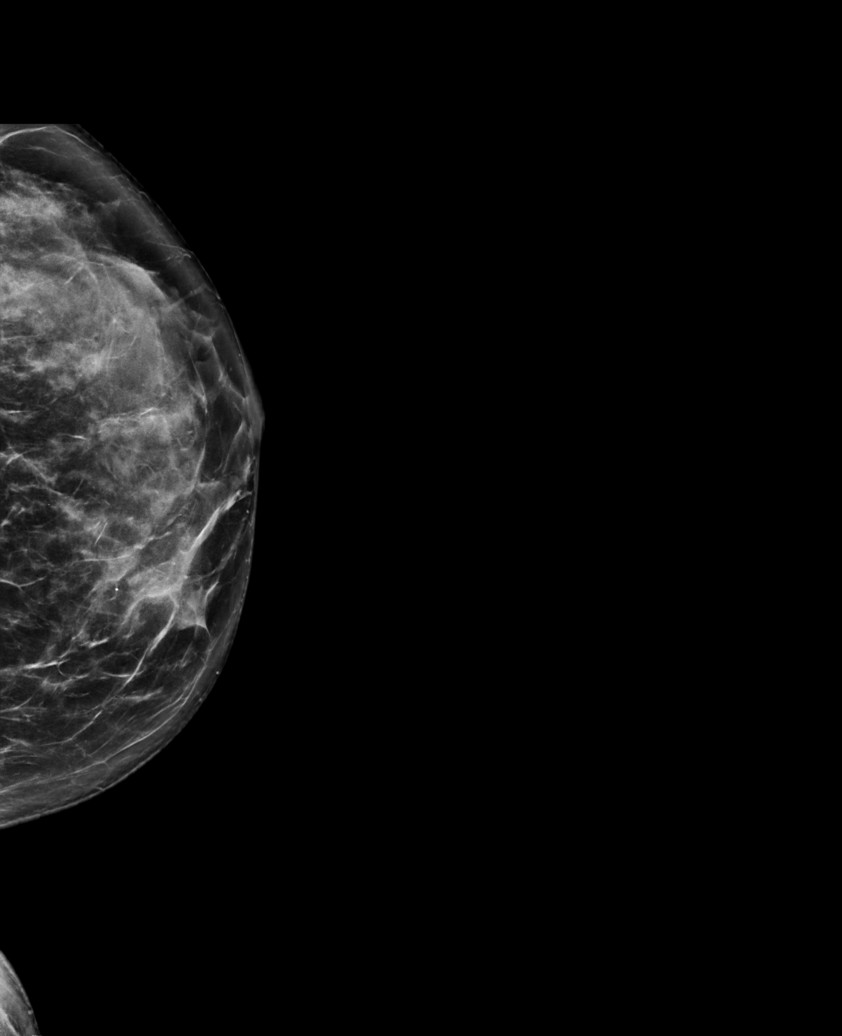

[L MLO synth-2D]
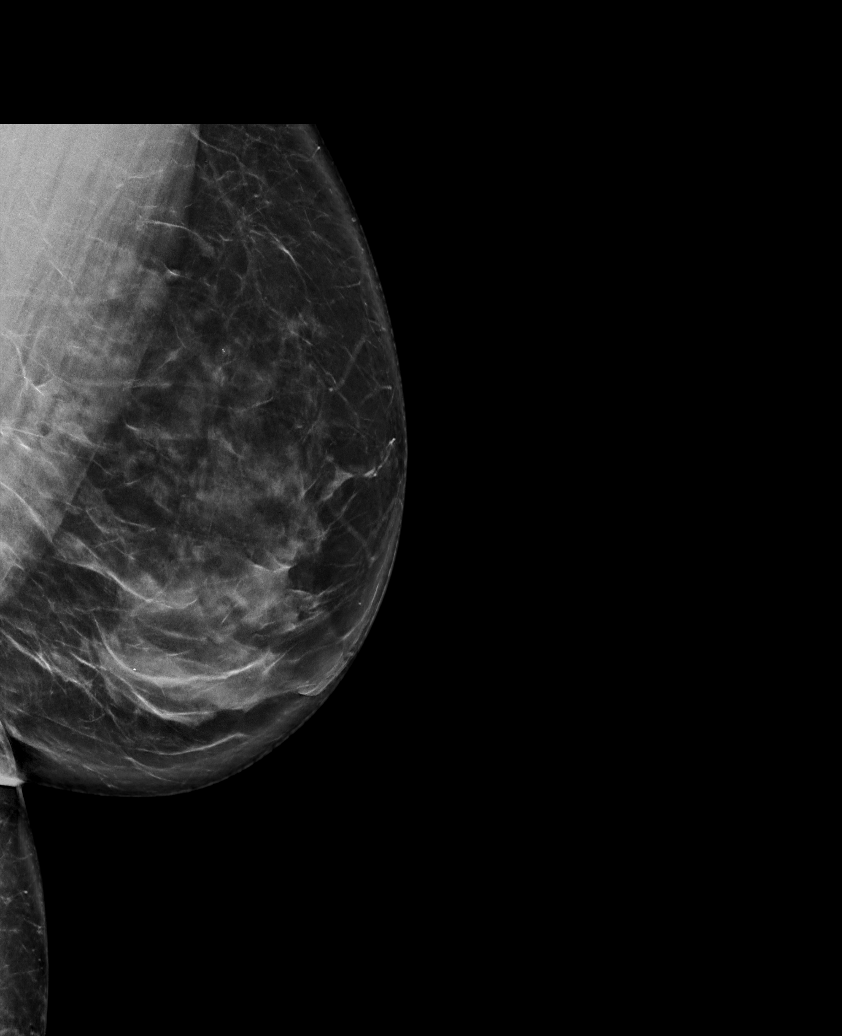

[L CC tomo · tomo slice 39/77.0]
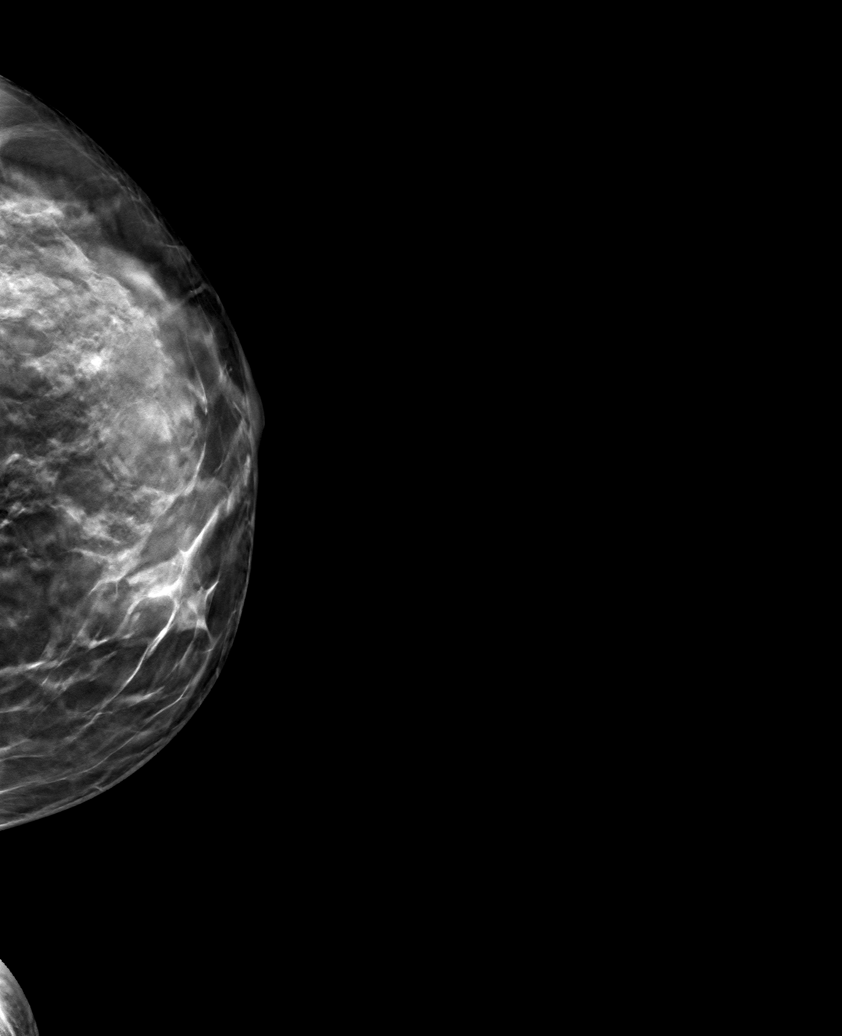

[6 of 30 positions shown; findings below may reference images not displayed]

ACR Breast Density Category c: The breast tissue is heterogeneously
dense, which may obscure small masses.
FINDINGS: There are no findings suspicious for malignancy.
IMPRESSION: No mammographic evidence of malignancy. A result letter of this
screening mammogram will be mailed directly to the patient.

RECOMMENDATION:
Screening mammogram in one year. (Code:[V2])

BI-RADS CATEGORY  1: Negative.

## 2021-06-09 ENCOUNTER — Other Ambulatory Visit: Payer: Self-pay | Admitting: Nurse Practitioner

## 2021-06-09 DIAGNOSIS — I10 Essential (primary) hypertension: Secondary | ICD-10-CM

## 2021-06-11 DIAGNOSIS — N189 Chronic kidney disease, unspecified: Secondary | ICD-10-CM | POA: Insufficient documentation

## 2021-08-07 ENCOUNTER — Other Ambulatory Visit: Payer: Self-pay | Admitting: Nurse Practitioner

## 2021-08-07 DIAGNOSIS — I10 Essential (primary) hypertension: Secondary | ICD-10-CM

## 2021-08-07 MED ORDER — AMLODIPINE BESYLATE 5 MG PO TABS: 5.0000 mg | ORAL_TABLET | Freq: Every day | ORAL | 2 refills | Status: DC

## 2021-08-19 ENCOUNTER — Other Ambulatory Visit: Payer: Self-pay

## 2021-08-19 ENCOUNTER — Encounter: Payer: Self-pay | Admitting: Nurse Practitioner

## 2021-08-19 ENCOUNTER — Ambulatory Visit (INDEPENDENT_AMBULATORY_CARE_PROVIDER_SITE_OTHER): Payer: Self-pay | Admitting: Nurse Practitioner

## 2021-08-19 VITALS — BP 142/80 | Temp 97.5°F | Ht 68.0 in | Wt 212.0 lb

## 2021-08-19 DIAGNOSIS — Z1211 Encounter for screening for malignant neoplasm of colon: Secondary | ICD-10-CM

## 2021-08-19 DIAGNOSIS — Z23 Encounter for immunization: Secondary | ICD-10-CM

## 2021-08-19 DIAGNOSIS — E7800 Pure hypercholesterolemia, unspecified: Secondary | ICD-10-CM

## 2021-08-19 DIAGNOSIS — Z6832 Body mass index (BMI) 32.0-32.9, adult: Secondary | ICD-10-CM

## 2021-08-19 DIAGNOSIS — I10 Essential (primary) hypertension: Secondary | ICD-10-CM

## 2021-08-19 DIAGNOSIS — E6609 Other obesity due to excess calories: Secondary | ICD-10-CM

## 2021-08-19 DIAGNOSIS — Z0000 Encounter for general adult medical examination without abnormal findings: Secondary | ICD-10-CM

## 2021-08-19 LAB — POCT URINALYSIS DIPSTICK
Bilirubin, UA: NEGATIVE
Blood, UA: NEGATIVE
Glucose, UA: NEGATIVE
Ketones, UA: NEGATIVE
Leukocytes, UA: NEGATIVE
Nitrite, UA: NEGATIVE
Protein, UA: NEGATIVE
Spec Grav, UA: <=1.005 — AB (ref 1.010–1.025)
Urobilinogen, UA: 0.2 E.U./dL
pH, UA: 6 (ref 5.0–8.0)

## 2021-08-19 LAB — POCT UA - MICROALBUMIN
Albumin/Creatinine Ratio, Urine, POC: 30
Creatinine, POC: 50 mg/dL
Microalbumin Ur, POC: 10 mg/L

## 2021-08-19 NOTE — Patient Instructions (Addendum)
Health Maintenance, Female Adopting a healthy lifestyle and getting preventive care are important in promoting health and wellness. Ask your health care provider about: The right schedule for you to have regular tests and exams. Things you can do on your own to prevent diseases and keep yourself healthy. What should I know about diet, weight, and exercise? Eat a healthy diet  Eat a diet that includes plenty of vegetables, fruits, low-fat dairy products, and lean protein. Do not eat a lot of foods that are high in solid fats, added sugars, or sodium. Maintain a healthy weight Body mass index (BMI) is used to identify weight problems. It estimates body fat based on height and weight. Your health care provider can help determine your BMI and help you achieve or maintain a healthy weight. Get regular exercise Get regular exercise. This is one of the most important things you can do for your health. Most adults should: Exercise for at least 150 minutes each week. The exercise should increase your heart rate and make you sweat (moderate-intensity exercise). Do strengthening exercises at least twice a week. This is in addition to the moderate-intensity exercise. Spend less time sitting. Even light physical activity can be beneficial. Watch cholesterol and blood lipids Have your blood tested for lipids and cholesterol at 58 years of age, then have this test every 5 years. Have your cholesterol levels checked more often if: Your lipid or cholesterol levels are high. You are older than 58 years of age. You are at high risk for heart disease. What should I know about cancer screening? Depending on your health history and family history, you may need to have cancer screening at various ages. This may include screening for: Breast cancer. Cervical cancer. Colorectal cancer. Skin cancer. Lung cancer. What should I know about heart disease, diabetes, and high blood pressure? Blood pressure and heart  disease High blood pressure causes heart disease and increases the risk of stroke. This is more likely to develop in people who have high blood pressure readings or are overweight. Have your blood pressure checked: Every 3-5 years if you are 18-39 years of age. Every year if you are 40 years old or older. Diabetes Have regular diabetes screenings. This checks your fasting blood sugar level. Have the screening done: Once every three years after age 40 if you are at a normal weight and have a low risk for diabetes. More often and at a younger age if you are overweight or have a high risk for diabetes. What should I know about preventing infection? Hepatitis B If you have a higher risk for hepatitis B, you should be screened for this virus. Talk with your health care provider to find out if you are at risk for hepatitis B infection. Hepatitis C Testing is recommended for: Everyone born from 1945 through 1965. Anyone with known risk factors for hepatitis C. Sexually transmitted infections (STIs) Get screened for STIs, including gonorrhea and chlamydia, if: You are sexually active and are younger than 58 years of age. You are older than 58 years of age and your health care provider tells you that you are at risk for this type of infection. Your sexual activity has changed since you were last screened, and you are at increased risk for chlamydia or gonorrhea. Ask your health care provider if you are at risk. Ask your health care provider about whether you are at high risk for HIV. Your health care provider may recommend a prescription medicine to help prevent HIV   infection. If you choose to take medicine to prevent HIV, you should first get tested for HIV. You should then be tested every 3 months for as long as you are taking the medicine. Pregnancy If you are about to stop having your period (premenopausal) and you may become pregnant, seek counseling before you get pregnant. Take 400 to 800  micrograms (mcg) of folic acid every day if you become pregnant. Ask for birth control (contraception) if you want to prevent pregnancy. Osteoporosis and menopause Osteoporosis is a disease in which the bones lose minerals and strength with aging. This can result in bone fractures. If you are 31 years old or older, or if you are at risk for osteoporosis and fractures, ask your health care provider if you should: Be screened for bone loss. Take a calcium or vitamin D supplement to lower your risk of fractures. Be given hormone replacement therapy (HRT) to treat symptoms of menopause. Follow these instructions at home: Alcohol use Do not drink alcohol if: Your health care provider tells you not to drink. You are pregnant, may be pregnant, or are planning to become pregnant. If you drink alcohol: Limit how much you have to: 0-1 drink a day. Know how much alcohol is in your drink. In the [place] one drink equals one 12 oz bottle of beer (355 mL), one 5 oz glass of wine (148 mL), or one 1 oz glass of hard liquor (44 mL). Lifestyle Do not use any products that contain nicotine or tobacco. These products include cigarettes, chewing tobacco, and vaping devices, such as e-cigarettes. If you need help quitting, ask your health care provider. Do not use street drugs. Do not share needles. Ask your health care provider for help if you need support or information about quitting drugs. General instructions Schedule regular health, dental, and eye exams. Stay current with your vaccines. Tell your health care provider if: You often feel depressed. You have ever been abused or do not feel safe at home. Summary Adopting a healthy lifestyle and getting preventive care are important in promoting health and wellness. Follow your health care provider's instructions about healthy diet, exercising, and getting tested or screened for diseases. Follow your health care provider's instructions on monitoring your  cholesterol and blood pressure. This information is not intended to replace advice given to you by your health care provider. Make sure you discuss any questions you have with your health care provider. Document Revised: 01/21/2021 Document Reviewed: 01/21/2021 Elsevier Patient Education  [place].  Zoster Vaccine, Recombinant injection What is this medication? ZOSTER VACCINE (ZOS ter vak SEEN) is a vaccine used to reduce the risk of getting shingles. This vaccine is not used to treat shingles or nerve pain from shingles. This medicine may be used for other purposes; ask your health care provider or pharmacist if you have questions. COMMON BRAND NAME(S): [facility] What should I tell my care team before I take this medication? They need to know if you have any of these conditions: cancer immune system problems an unusual or allergic reaction to Zoster vaccine, other medications, foods, dyes, or preservatives pregnant or trying to get pregnant breast-feeding How should I use this medication? This vaccine is injected into a muscle. It is given by a health care provider. A copy of Vaccine Information Statements will be given before each vaccination. Be sure to read this information carefully each time. This sheet may change often. Talk to your health care provider about the use of this vaccine  in children. This vaccine is not approved for use in children. Overdosage: If you think you have taken too much of this medicine contact a poison control center or emergency room at once. NOTE: This medicine is only for you. Do not share this medicine with others. What if I miss a dose? Keep appointments for follow-up (booster) doses. It is important not to miss your dose. Call your health care provider if you are unable to keep an appointment. What may interact with this medication? medicines that suppress your immune system medicines to treat cancer steroid medicines like prednisone or  cortisone This list may not describe all possible interactions. Give your health care provider a list of all the medicines, herbs, non-prescription drugs, or dietary supplements you use. Also tell them if you smoke, drink alcohol, or use illegal drugs. Some items may interact with your medicine. What should I watch for while using this medication? Visit your health care provider regularly. This vaccine, like all vaccines, may not fully protect everyone. What side effects may I notice from receiving this medication? Side effects that you should report to your doctor or health care professional as soon as possible: allergic reactions (skin rash, itching or hives; swelling of the face, lips, or tongue) trouble breathing Side effects that usually do not require medical attention (report these to your doctor or health care professional if they continue or are bothersome): chills headache fever nausea pain, redness, or irritation at site where injected tiredness vomiting This list may not describe all possible side effects. Call your doctor for medical advice about side effects. You may report side effects to FDA at 1-800-FDA-1088. Where should I keep my medication? This vaccine is only given by a health care provider. It will not be stored at home. NOTE: This sheet is a summary. It may not cover all possible information. If you have questions about this medicine, talk to your doctor, pharmacist, or health care provider.  2022 Elsevier/Gold Standard (2021-05-21 00:00:00)

## 2021-08-19 NOTE — Progress Notes (Signed)
I,Yamilka J [place],acting as a Education administrator for [employer], FNP.,have documented all relevant documentation on the behalf of Minette Brine, FNP,as directed by  Minette Brine, FNP while in the presence of Minette Brine, [place].   This visit occurred during the SARS-CoV-2 public health emergency.  Safety protocols were in place, including screening questions prior to the visit, additional usage of staff PPE, and extensive cleaning of exam room while observing appropriate contact time as indicated for disinfecting solutions.  Subjective:     Patient ID: Nicole Hunt , female    DOB: 1961/02/14 , 60 y.o.   MRN: [id number]   Chief Complaint  Patient presents with   Annual Exam    HPI  Patient is here for a physical exam. She does not have a GYN. Her last PAP was in June 2020, she has no concerns today.  Nephrologist stopped her HCTZ and changed to losartan, next appt is January  Wt Readings from Last 3 Encounters: 08/19/21 : 212 lb (96.2 kg) 03/19/21 : 211 lb (95.7 kg) 12/18/20 : 214 lb 3.2 oz (97.2 kg)    Hypertension This is a chronic problem. The current episode started more than 1 month ago. The problem has been gradually worsening since onset. The problem is uncontrolled. Pertinent negatives include no chest pain, headaches, palpitations or shortness of breath. Risk factors for coronary artery disease include sedentary lifestyle. Past treatments include diuretics. The current treatment provides mild improvement. Compliance problems include diet and exercise.     Past Medical History:  Diagnosis Date   Arthritis    Heart murmur      Family History  Problem Relation Age of Onset   Cancer Mother    Hypothyroidism Mother    Arthritis Mother    Stroke Father    Hypertension Father    Hypertension Sister    Hypertension Brother    Healthy Brother    Congestive Heart Failure Maternal Grandmother      Current Outpatient Medications:    amLODipine (NORVASC) 5 MG tablet, Take  1 tablet (5 mg total) by mouth daily., Disp: 30 tablet, Rfl: 2   Magnesium 250 MG TABS, Take 1 tablet (250 mg total) by mouth every evening. Take with evening meal., Disp: 90 tablet, Rfl: 1   losartan (COZAAR) 25 MG tablet, Take 25 mg by mouth daily., Disp: , Rfl:    No Known Allergies    The patient states she is post menopausal status for birth control. No LMP recorded. Patient is postmenopausal.. Negative for Dysmenorrhea and Negative for Menorrhagia. Negative for: breast discharge, breast lump(s), breast pain and breast self exam. Associated symptoms include abnormal vaginal bleeding. Pertinent negatives include abnormal bleeding (hematology), anxiety, decreased libido, depression, difficulty falling sleep, dyspareunia, history of infertility, nocturia, sexual dysfunction, sleep disturbances, urinary incontinence, urinary urgency, vaginal discharge and vaginal itching. Diet regula; she has cut back on her bread intake. The patient states her exercise level is 5 days a week approximately 90 minutes.     . The patient's tobacco use is:  Social History   Tobacco Use  Smoking Status Never  Smokeless Tobacco Never   She has been exposed to passive smoke. The patient's alcohol use is:  Social History   Substance and Sexual Activity  Alcohol Use Never   Additional information: Last pap June 2020, next one scheduled for 2023.    Review of Systems  Constitutional: Negative.   HENT: Negative.    Eyes: Negative.   Respiratory: Negative.  Negative for shortness  of breath.   Cardiovascular: Negative.  Negative for chest pain, palpitations and leg swelling.  Gastrointestinal: Negative.   Endocrine: Negative.   Genitourinary: Negative.   Musculoskeletal: Negative.   Skin: Negative.   Allergic/Immunologic: Negative.   Neurological: Negative.  Negative for headaches.  Hematological: Negative.   Psychiatric/Behavioral: Negative.      Today's Vitals   08/19/21 0832  BP: (!) 142/80  Temp:  (!) 97.5 F (36.4 C)  Weight: 212 lb (96.2 kg)  Height: 5\' 8"  (1.727 m)  PainSc: 0-No pain   Body mass index is 32.23 kg/m.   Objective:  Physical Exam Vitals reviewed.  Constitutional:      General: She is not in acute distress.    Appearance: Normal appearance. She is well-developed. She is obese.  HENT:     Head: Normocephalic and atraumatic.     Right Ear: Hearing, tympanic membrane, ear canal and external ear normal. There is no impacted cerumen.     Left Ear: Hearing, tympanic membrane, ear canal and external ear normal. There is no impacted cerumen.     Nose:     Comments: Deferred - masked    Mouth/Throat:     Comments: Deferred - masked Eyes:     General: Lids are normal.     Extraocular Movements: Extraocular movements intact.     Conjunctiva/sclera: Conjunctivae normal.     Pupils: Pupils are equal, round, and reactive to light.     Funduscopic exam:    Right eye: No papilledema.        Left eye: No papilledema.  Neck:     Thyroid: No thyroid mass.     Vascular: No carotid bruit.  Cardiovascular:     Rate and Rhythm: Normal rate and regular rhythm.     Pulses: Normal pulses.     Heart sounds: Normal heart sounds. No murmur heard. Pulmonary:     Effort: Pulmonary effort is normal.     Breath sounds: Normal breath sounds.  Chest:     Chest wall: No mass.  Breasts:    Tanner Score is 5.     Right: Normal. No mass or tenderness.     Left: Normal. No mass or tenderness.  Abdominal:     General: Abdomen is flat. Bowel sounds are normal. There is no distension.     Palpations: Abdomen is soft.     Tenderness: There is no abdominal tenderness.  Genitourinary:    Rectum: Guaiac result negative.  Musculoskeletal:        General: No swelling. Normal range of motion.     Cervical back: Full passive range of motion without pain, normal range of motion and neck supple.     Right lower leg: No edema.     Left lower leg: No edema.  Lymphadenopathy:     Upper  Body:     Right upper body: No supraclavicular, axillary or pectoral adenopathy.     Left upper body: No supraclavicular, axillary or pectoral adenopathy.  Skin:    General: Skin is warm and dry.     Capillary Refill: Capillary refill takes less than 2 seconds.  Neurological:     General: No focal deficit present.     Mental Status: She is alert and oriented to person, place, and time.     Cranial Nerves: No cranial nerve deficit.     Sensory: No sensory deficit.  Psychiatric:        Mood and Affect: Mood normal.  Behavior: Behavior normal.        Thought Content: Thought content normal.        Judgment: Judgment normal.        Assessment And Plan:     1. Encounter for general adult medical examination w/o abnormal findings Behavior modifications discussed and diet history reviewed.   Pt will continue to exercise regularly and modify diet with low GI, plant based foods and decrease intake of processed foods.  Recommend intake of daily multivitamin, Vitamin D, and calcium.  Recommend mammogram (up to date) and colonoscopy (she will provide copy of colonoscopy and her last PAP) for preventive screenings, as well as recommend immunizations that include influenza, TDAP, and Shingles (given in office)  2. Essential hypertension Comments: Blood pressure is slightly elevated today after eating a high salt meal last pm. She is to check B/P at home, return in 3 months EKG done with NSR HR 81 - POCT Urinalysis Dipstick (81002) - POCT UA - Microalbumin - EKG 12-Lead  3. Elevated cholesterol Comments: No current medications, encouraged to eat a low fat diet Will check lipid panel  4. Encounter for immunization - Varicella-zoster vaccine IM (Shingrix)     Patient was given opportunity to ask questions. Patient verbalized understanding of the plan and was able to repeat key elements of the plan. All questions were answered to their satisfaction.   Minette Brine, FNP   I, Minette Brine, FNP, have reviewed all documentation for this visit. The documentation on 08/19/21 for the exam, diagnosis, procedures, and orders are all accurate and complete.   THE PATIENT IS ENCOURAGED TO PRACTICE SOCIAL DISTANCING DUE TO THE COVID-19 PANDEMIC.

## 2021-08-20 LAB — CMP14+EGFR
ALT: 18 IU/L (ref 0–32)
AST: 27 IU/L (ref 0–40)
Albumin/Globulin Ratio: 1.5 (ref 1.2–2.2)
Albumin: 4.5 g/dL (ref 3.8–4.9)
Alkaline Phosphatase: 82 IU/L (ref 44–121)
BUN/Creatinine Ratio: 9 (ref 9–23)
BUN: 10 mg/dL (ref 6–24)
Bilirubin Total: 1.3 mg/dL — ABNORMAL HIGH (ref 0.0–1.2)
CO2: 23 mmol/L (ref 20–29)
Calcium: 10.2 mg/dL (ref 8.7–10.2)
Chloride: 106 mmol/L (ref 96–106)
Creatinine, Ser: 1.17 mg/dL — ABNORMAL HIGH (ref 0.57–1.00)
Globulin, Total: 3.1 g/dL (ref 1.5–4.5)
Glucose: 95 mg/dL (ref 70–99)
Potassium: 4.2 mmol/L (ref 3.5–5.2)
Sodium: 140 mmol/L (ref 134–144)
Total Protein: 7.6 g/dL (ref 6.0–8.5)
eGFR: 54 mL/min/{1.73_m2} — ABNORMAL LOW (ref >59)

## 2021-08-20 LAB — LIPID PANEL
Chol/HDL Ratio: 3.6 ratio (ref 0.0–4.4)
Cholesterol, Total: 188 mg/dL (ref 100–199)
HDL: 52 mg/dL (ref >39)
LDL Chol Calc (NIH): 125 mg/dL — ABNORMAL HIGH (ref 0–99)
Triglycerides: 60 mg/dL (ref 0–149)
VLDL Cholesterol Cal: 11 mg/dL (ref 5–40)

## 2021-08-20 LAB — HEMOGLOBIN A1C
Est. average glucose Bld gHb Est-mCnc: 103 mg/dL
Hgb A1c MFr Bld: 5.2 % (ref 4.8–5.6)

## 2021-09-25 ENCOUNTER — Other Ambulatory Visit: Payer: Self-pay | Admitting: Urology

## 2021-09-25 DIAGNOSIS — D49512 Neoplasm of unspecified behavior of left kidney: Secondary | ICD-10-CM

## 2021-10-09 ENCOUNTER — Ambulatory Visit
Admission: RE | Admit: 2021-10-09 | Discharge: 2021-10-09 | Disposition: A | Discharge status: Home / Self Care | Source: Ambulatory Visit | Attending: Urology | Admitting: Urology

## 2021-10-09 ENCOUNTER — Other Ambulatory Visit: Payer: Self-pay

## 2021-10-09 DIAGNOSIS — D49512 Neoplasm of unspecified behavior of left kidney: Secondary | ICD-10-CM

## 2021-10-09 IMAGING — MR MR ABDOMEN WO/W CM
10 of 17 series · 26 of 48 positions shown · IV contrast (multihance)
Comparison: MRI [DATE] and ultrasound [DATE]

CLINICAL DATA: Follow-up left renal lesion.

EXAM:
MRI ABDOMEN WITHOUT AND WITH CONTRAST
TECHNIQUE: Multiplanar multisequence MR imaging of the abdomen was performed
both before and after the administration of intravenous contrast.
CONTRAST:  18mL MULTIHANCE GADOBENATE DIMEGLUMINE 529 MG/ML IV SOLN

[Series 3: cor haste · coronal · 5.0mm · 0.68mm/px · 2 of 32 slices shown]
[im 1/32]
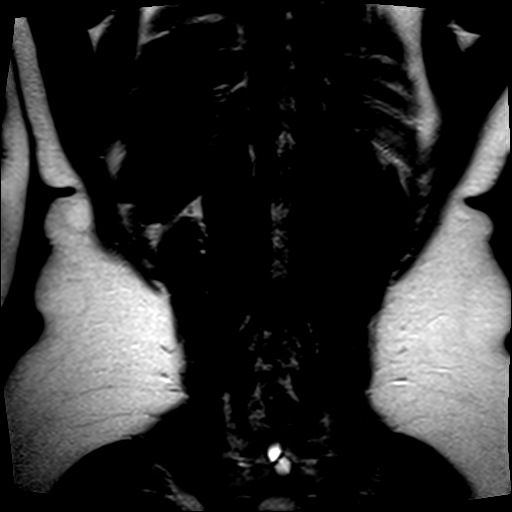
[im 32/32]
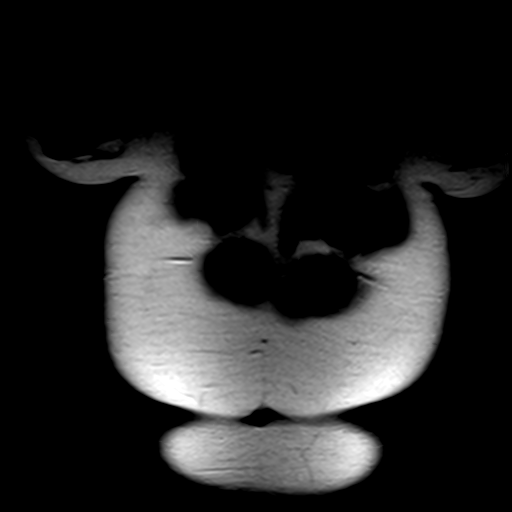

[Series 4: axial haste · axial · 6.0mm · 0.68mm/px · z∈[-63,+174]mm · 2 of 37 slices shown]
[im 1/37]
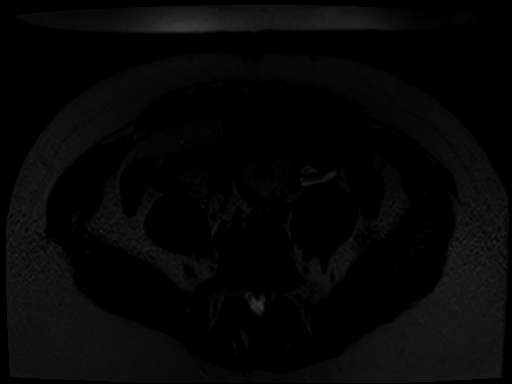
[im 37/37]
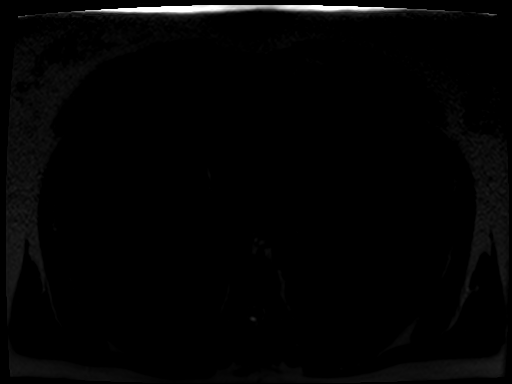

[Series 5: T1 · axial · 6.0mm · 0.68mm/px · z∈[-56,+168]mm · 4 of 70 slices shown]
[im 1/70]
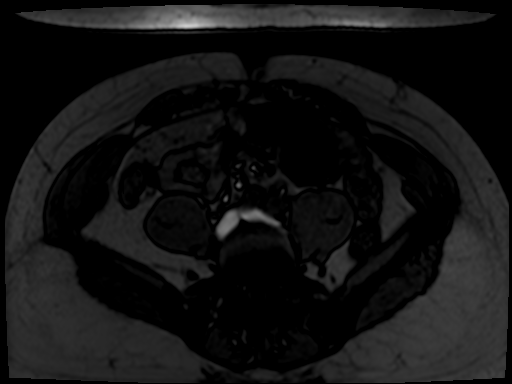
[im 24/70]
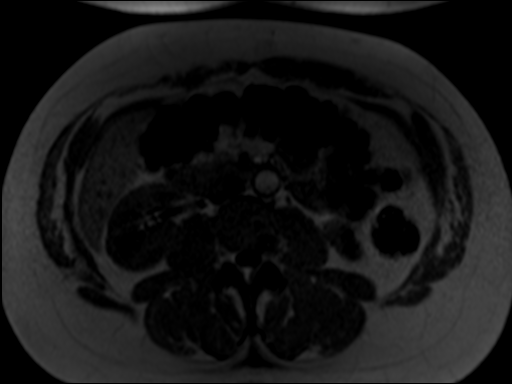
[im 47/70]
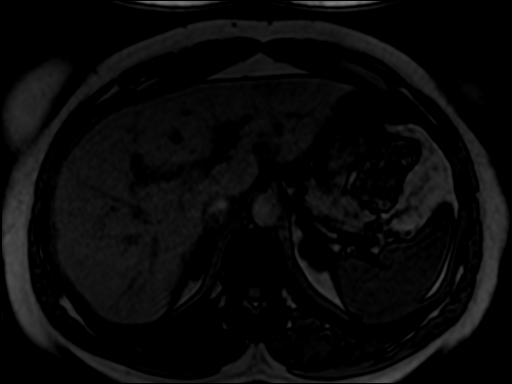
[im 70/70]
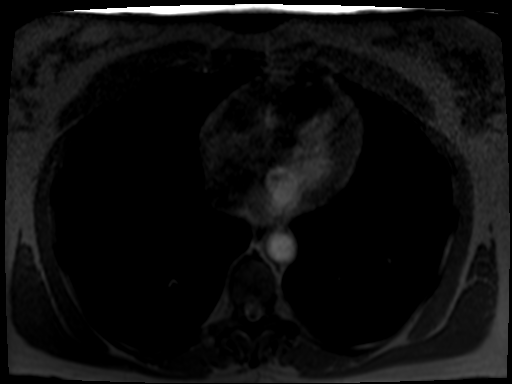

[Series 6: bSSFP · axial · 4.0mm · 0.68mm/px · z∈[-64,+176]mm · 3 of 61 slices shown]
[im 1/61]
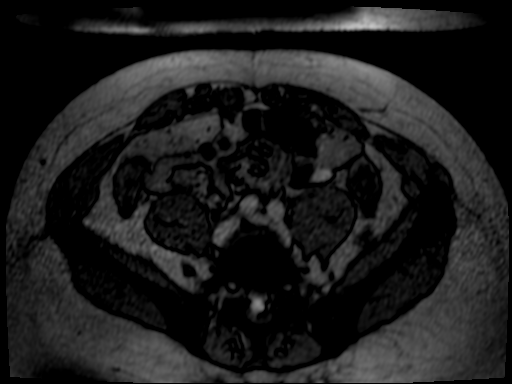
[im 31/61]
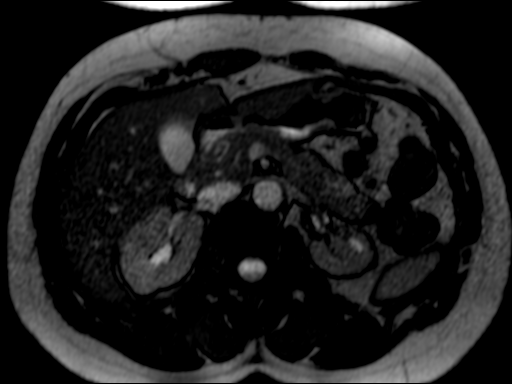
[im 61/61]
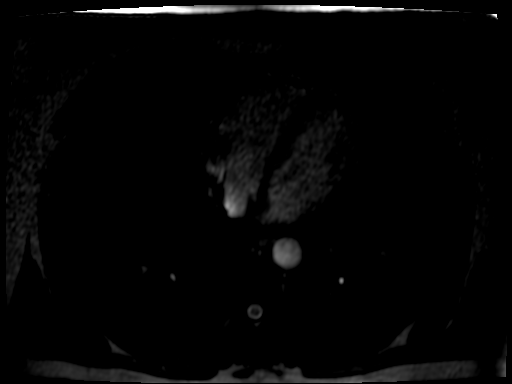

[Series 7: T2 fat-sat · axial · 6.0mm · 1.09mm/px · z∈[-39,+206]mm · 2 of 35 slices shown]
[im 1/35]
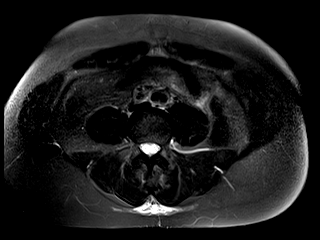
[im 35/35]
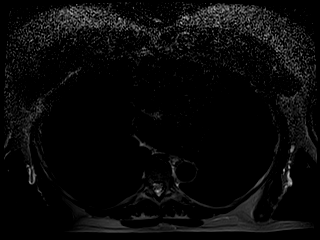

[Series 8: ep2d_diff_b50_500_800_p2_trig · axial · 6.0mm · 1.82mm/px · z∈[-39,+206]mm · 4 of 105 slices shown]
[im 1/105]
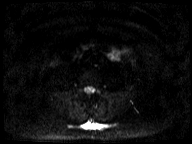
[im 35/105]
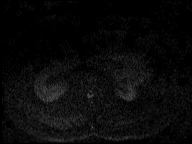
[im 70/105]
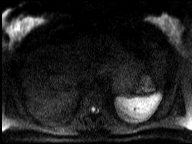
[im 105/105]
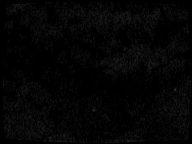

[Series 9: ep2d_diff_b50_500_800_p2_trig_adc · axial · 6.0mm · 1.82mm/px · 1 of 35 slices shown]
[im 1/35]
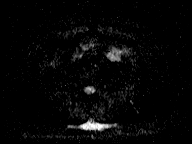

[Series 10: T1 dynamic · axial · non-contrast · 2.5mm · 0.74mm/px · z∈[-60,+177]mm · 3 of 96 slices shown]
[im 1/96]
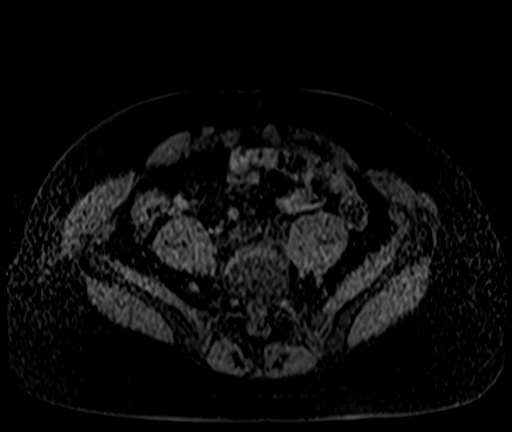
[im 48/96]
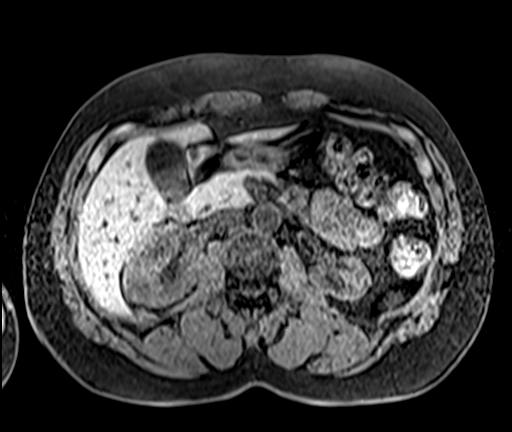
[im 96/96]
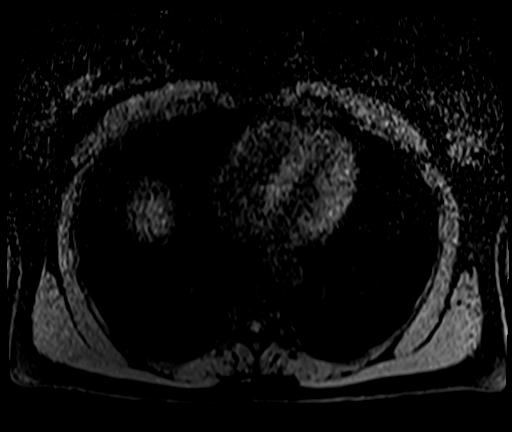

[Series 11: T1 dynamic post-contrast · axial · 2.5mm · 0.74mm/px · z∈[-60,+177]mm · 3 of 96 slices shown (1 of 2)]
[im 1/96]
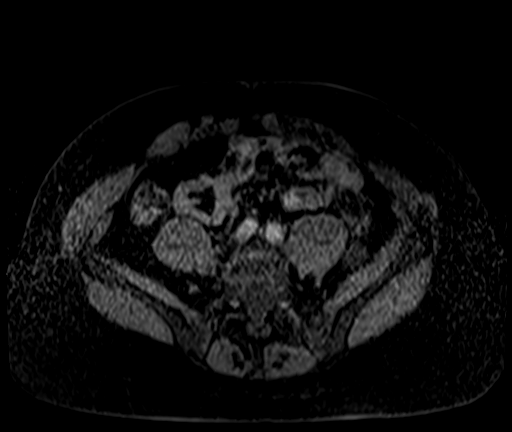
[im 48/96]
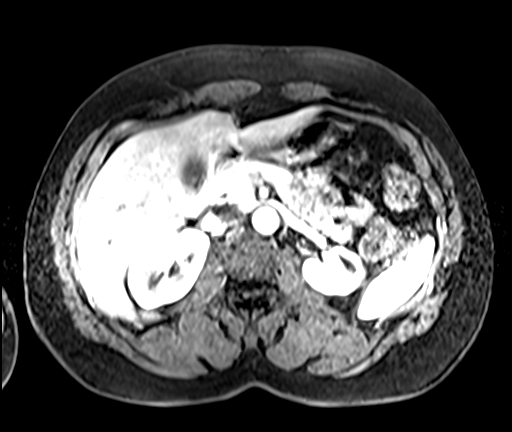
[im 96/96]
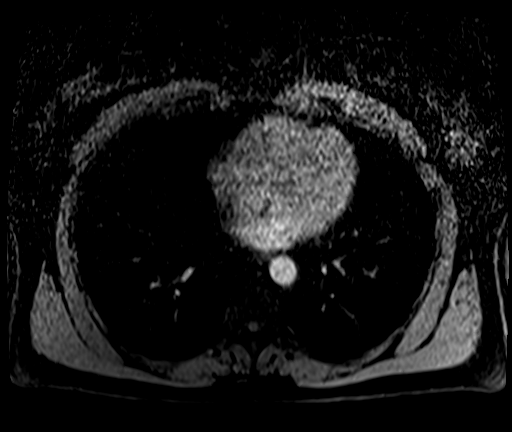

[Series 12: T1 dynamic post-contrast · axial · 2.5mm · 0.74mm/px · z∈[-60,+57]mm · 2 of 96 slices shown (2 of 2)]
[im 1/96]
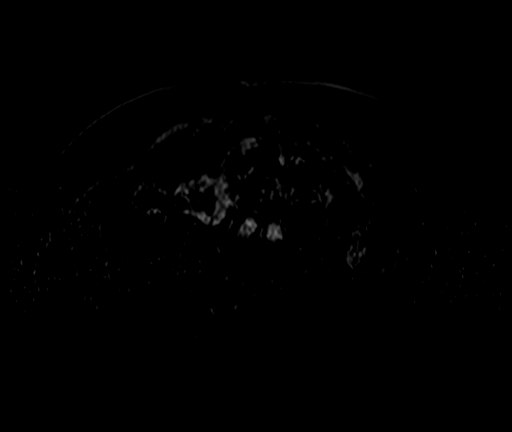
[im 48/96]
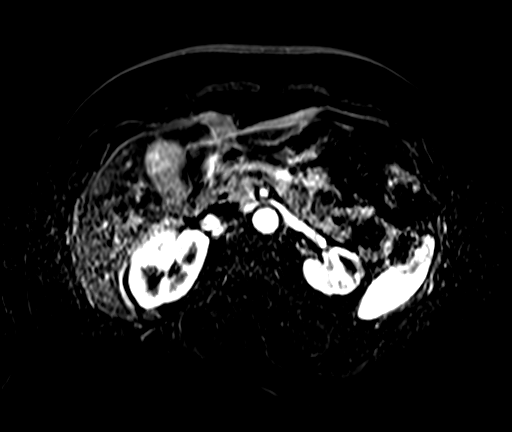

[26 of 48 positions shown; findings below may reference images not displayed]

FINDINGS: Lower chest: No acute abnormality.

Hepatobiliary: No significant hepatic steatosis. No suspicious
hepatic lesion. Gallbladder is unremarkable. No biliary ductal
dilation.

Pancreas: Intrinsic T1 signal of the pancreatic parenchyma is within
normal limits. Homogeneous pancreatic parenchymal enhancement
postcontrast administration. No pancreatic ductal dilation. No
cystic or solid hyperenhancing pancreatic lesion identified.

Spleen:  Within normal limits in size and appearance.

Adrenals/Urinary Tract:  Bilateral adrenal glands appear normal.

The right kidney is unremarkable without hydronephrosis or mass.

Similar left renal atrophy without hydronephrosis. 2 small fluid
density cysts are again seen in the upper pole left kidney measuring
up to 9 mm. The small interpolar renal lesion seen on prior study
which is T2 hypointense and slightly T1 hyperintense is decreased in
size not demonstrating crenulated appearance on measuring
approximately 3 mm on image [DATE]. While this lesion is too small to
accurately characterize there is no visible postcontrast enhancement
for instance on image 57/18, and almost certainly reflects an
involuting hemorrhagic/proteinaceous cyst. No solid enhancing renal
lesions identified.

Stomach/Bowel: Fluid density lesion with thin enhancing septations
arising along the lateral aspect of the stomach and within the
splenic hilum which measures 5.0 x 2.4 x 3.5 cm on images [DATE] and
[DATE], which in retrospect was present on prior imaging measuring
x 2.2 x 2.7 cm.

Formed stool throughout the visualized colon. No pathologic dilation
or evidence of acute inflammation involving loops of large or small
bowel in the abdomen.

Vascular/Lymphatic: No pathologically enlarged abdominal lymph
nodes. No abdominal aortic aneurysm.

Other:  No abdominal ascites.

Musculoskeletal: No suspicious bone lesions identified.
IMPRESSION: 1. The small interpolar renal lesion seen on prior study is
decreased in size now measuring approximately 3 mm and while this is
too small to accurately characterize there is no visible
postcontrast enhancement on either today's MRI or MRI [DATE] and almost certainly reflects a benign involuting
hemorrhagic/proteinaceous cyst. Given the lesions small size and
inability to definitively characterize this lesion consider a 1 year
follow-up renal protocol MRI with and without contrast to assess
stability.
2. Similar left renal atrophy without hydronephrosis.
3. Fluid density 5 cm lesion with thin walls and thin internal
septations and no suspicious enhancement arising along the lateral
aspect of the stomach and within the splenic hilum, which in
retrospect was present on prior imaging and has slightly increased
in size, this is nonspecific with a primary differential
consideration of a lymphocele with secondary differential
considerations of a pseudocyst or duplication cyst. Consider
attention on follow-up imaging versus further evaluation with EUS
and fluid sampling if clinically indicated.

## 2021-10-09 MED ORDER — GADOBENATE DIMEGLUMINE 529 MG/ML IV SOLN
18.0000 mL | Freq: Once | INTRAVENOUS | Status: AC | PRN
  Administered 2021-10-09: 18 mL via INTRAVENOUS

## 2021-10-11 ENCOUNTER — Emergency Department (HOSPITAL_COMMUNITY)

## 2021-10-11 ENCOUNTER — Other Ambulatory Visit: Payer: Self-pay

## 2021-10-11 ENCOUNTER — Encounter (HOSPITAL_COMMUNITY): Payer: Self-pay

## 2021-10-11 ENCOUNTER — Observation Stay (HOSPITAL_COMMUNITY)
Admission: EM | Admit: 2021-10-11 | Discharge: 2021-10-12 | Disposition: A | Discharge status: Home / Self Care | Attending: Orthopaedic Surgery | Admitting: Orthopaedic Surgery

## 2021-10-11 DIAGNOSIS — N1831 Chronic kidney disease, stage 3a: Secondary | ICD-10-CM | POA: Diagnosis not present

## 2021-10-11 DIAGNOSIS — Z419 Encounter for procedure for purposes other than remedying health state, unspecified: Secondary | ICD-10-CM

## 2021-10-11 DIAGNOSIS — S82851A Displaced trimalleolar fracture of right lower leg, initial encounter for closed fracture: Secondary | ICD-10-CM

## 2021-10-11 DIAGNOSIS — W19XXXA Unspecified fall, initial encounter: Secondary | ICD-10-CM

## 2021-10-11 DIAGNOSIS — Z79899 Other long term (current) drug therapy: Secondary | ICD-10-CM | POA: Diagnosis not present

## 2021-10-11 DIAGNOSIS — X501XXA Overexertion from prolonged static or awkward postures, initial encounter: Secondary | ICD-10-CM | POA: Diagnosis not present

## 2021-10-11 DIAGNOSIS — Z20822 Contact with and (suspected) exposure to covid-19: Secondary | ICD-10-CM | POA: Diagnosis not present

## 2021-10-11 DIAGNOSIS — S99911A Unspecified injury of right ankle, initial encounter: Secondary | ICD-10-CM | POA: Diagnosis present

## 2021-10-11 DIAGNOSIS — T148XXA Other injury of unspecified body region, initial encounter: Secondary | ICD-10-CM | POA: Diagnosis present

## 2021-10-11 DIAGNOSIS — I129 Hypertensive chronic kidney disease with stage 1 through stage 4 chronic kidney disease, or unspecified chronic kidney disease: Secondary | ICD-10-CM | POA: Insufficient documentation

## 2021-10-11 HISTORY — DX: Essential (primary) hypertension: I10

## 2021-10-11 HISTORY — DX: Displaced trimalleolar fracture of right lower leg, initial encounter for closed fracture: S82.851A

## 2021-10-11 HISTORY — DX: Sleep apnea, unspecified: G47.30

## 2021-10-11 HISTORY — DX: Chronic kidney disease, unspecified: N18.9

## 2021-10-11 LAB — CBC WITH DIFFERENTIAL/PLATELET
Abs Immature Granulocytes: 0.01 10*3/uL (ref 0.00–0.07)
Basophils Absolute: 0 10*3/uL (ref 0.0–0.1)
Basophils Relative: 1 %
Eosinophils Absolute: 0 10*3/uL (ref 0.0–0.5)
Eosinophils Relative: 0 %
HCT: 37.4 % (ref 36.0–46.0)
Hemoglobin: 11.8 g/dL — ABNORMAL LOW (ref 12.0–15.0)
Immature Granulocytes: 0 %
Lymphocytes Relative: 21 %
Lymphs Abs: 1.5 10*3/uL (ref 0.7–4.0)
MCH: 28.6 pg (ref 26.0–34.0)
MCHC: 31.6 g/dL (ref 30.0–36.0)
MCV: 90.8 fL (ref 80.0–100.0)
Monocytes Absolute: 0.4 10*3/uL (ref 0.1–1.0)
Monocytes Relative: 6 %
Neutro Abs: 5.3 10*3/uL (ref 1.7–7.7)
Neutrophils Relative %: 72 %
Platelets: 255 10*3/uL (ref 150–400)
RBC: 4.12 MIL/uL (ref 3.87–5.11)
RDW: 12.3 % (ref 11.5–15.5)
WBC: 7.3 10*3/uL (ref 4.0–10.5)
nRBC: 0 % (ref 0.0–0.2)

## 2021-10-11 LAB — BASIC METABOLIC PANEL
Anion gap: 5 (ref 5–15)
BUN: 13 mg/dL (ref 6–20)
CO2: 23 mmol/L (ref 22–32)
Calcium: 9.7 mg/dL (ref 8.9–10.3)
Chloride: 107 mmol/L (ref 98–111)
Creatinine, Ser: 1.08 mg/dL — ABNORMAL HIGH (ref 0.44–1.00)
GFR, Estimated: 60 mL/min — ABNORMAL LOW (ref >60)
Glucose, Bld: 103 mg/dL — ABNORMAL HIGH (ref 70–99)
Potassium: 4.1 mmol/L (ref 3.5–5.1)
Sodium: 135 mmol/L (ref 135–145)

## 2021-10-11 LAB — RESP PANEL BY RT-PCR (FLU A&B, COVID) ARPGX2
Influenza A by PCR: NEGATIVE
Influenza B by PCR: NEGATIVE
SARS Coronavirus 2 by RT PCR: NEGATIVE

## 2021-10-11 IMAGING — DX DG ANKLE PORT 2V*R*
2 series · 2 of 2 positions shown · non-contrast
Comparison: Same day.

CLINICAL DATA: Status post reduction of ankle fracture.

EXAM:
PORTABLE RIGHT ANKLE - 2 VIEW

[ankle ap]
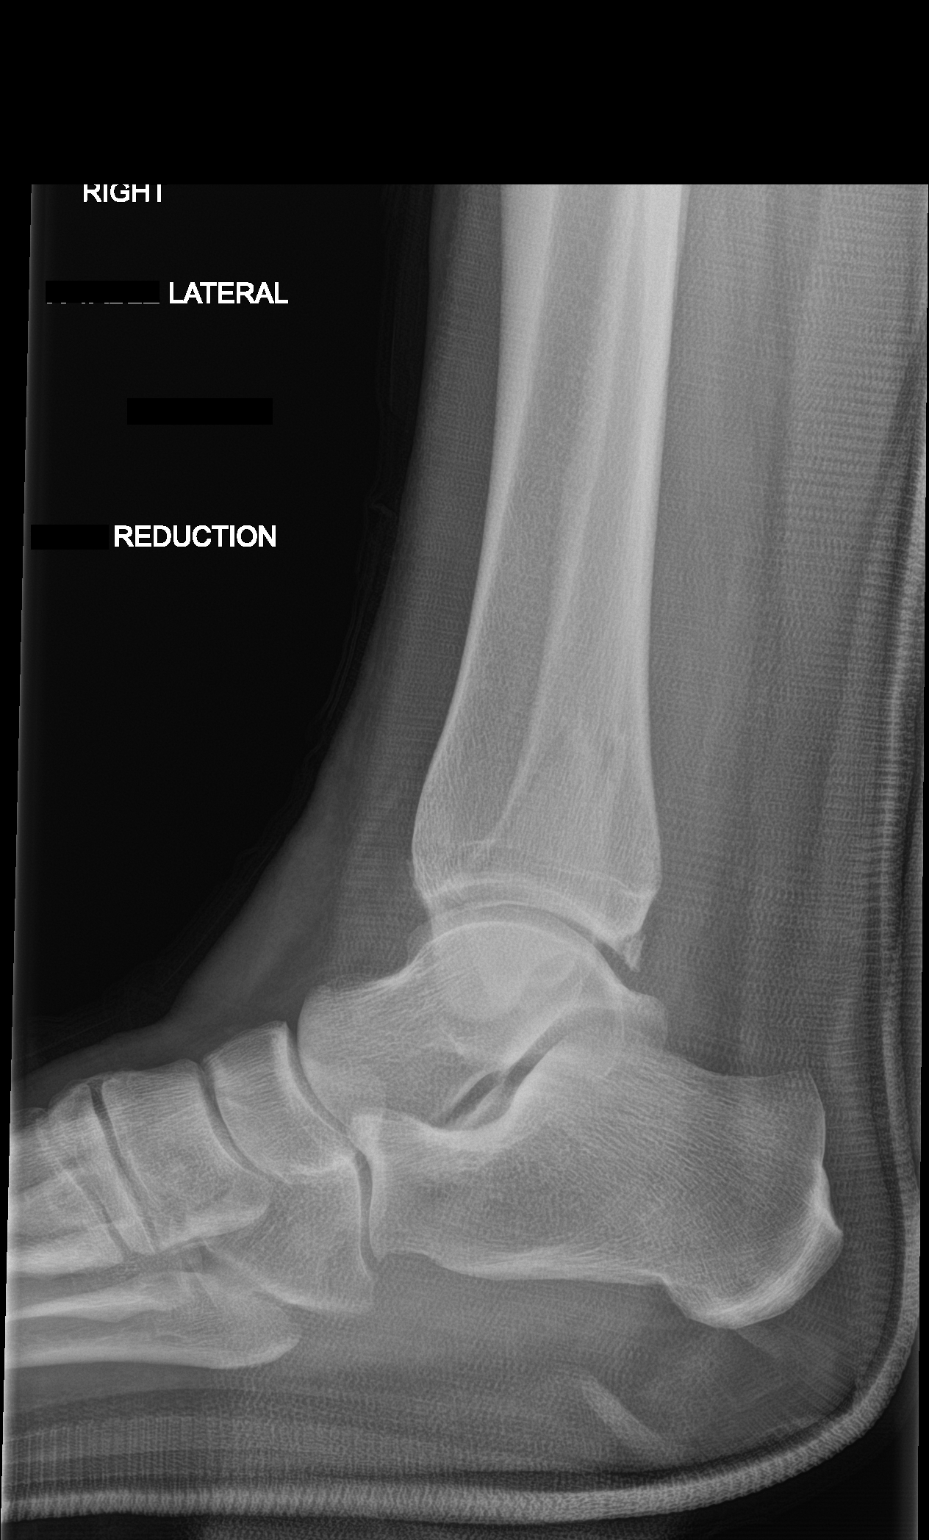

[ankle lat]
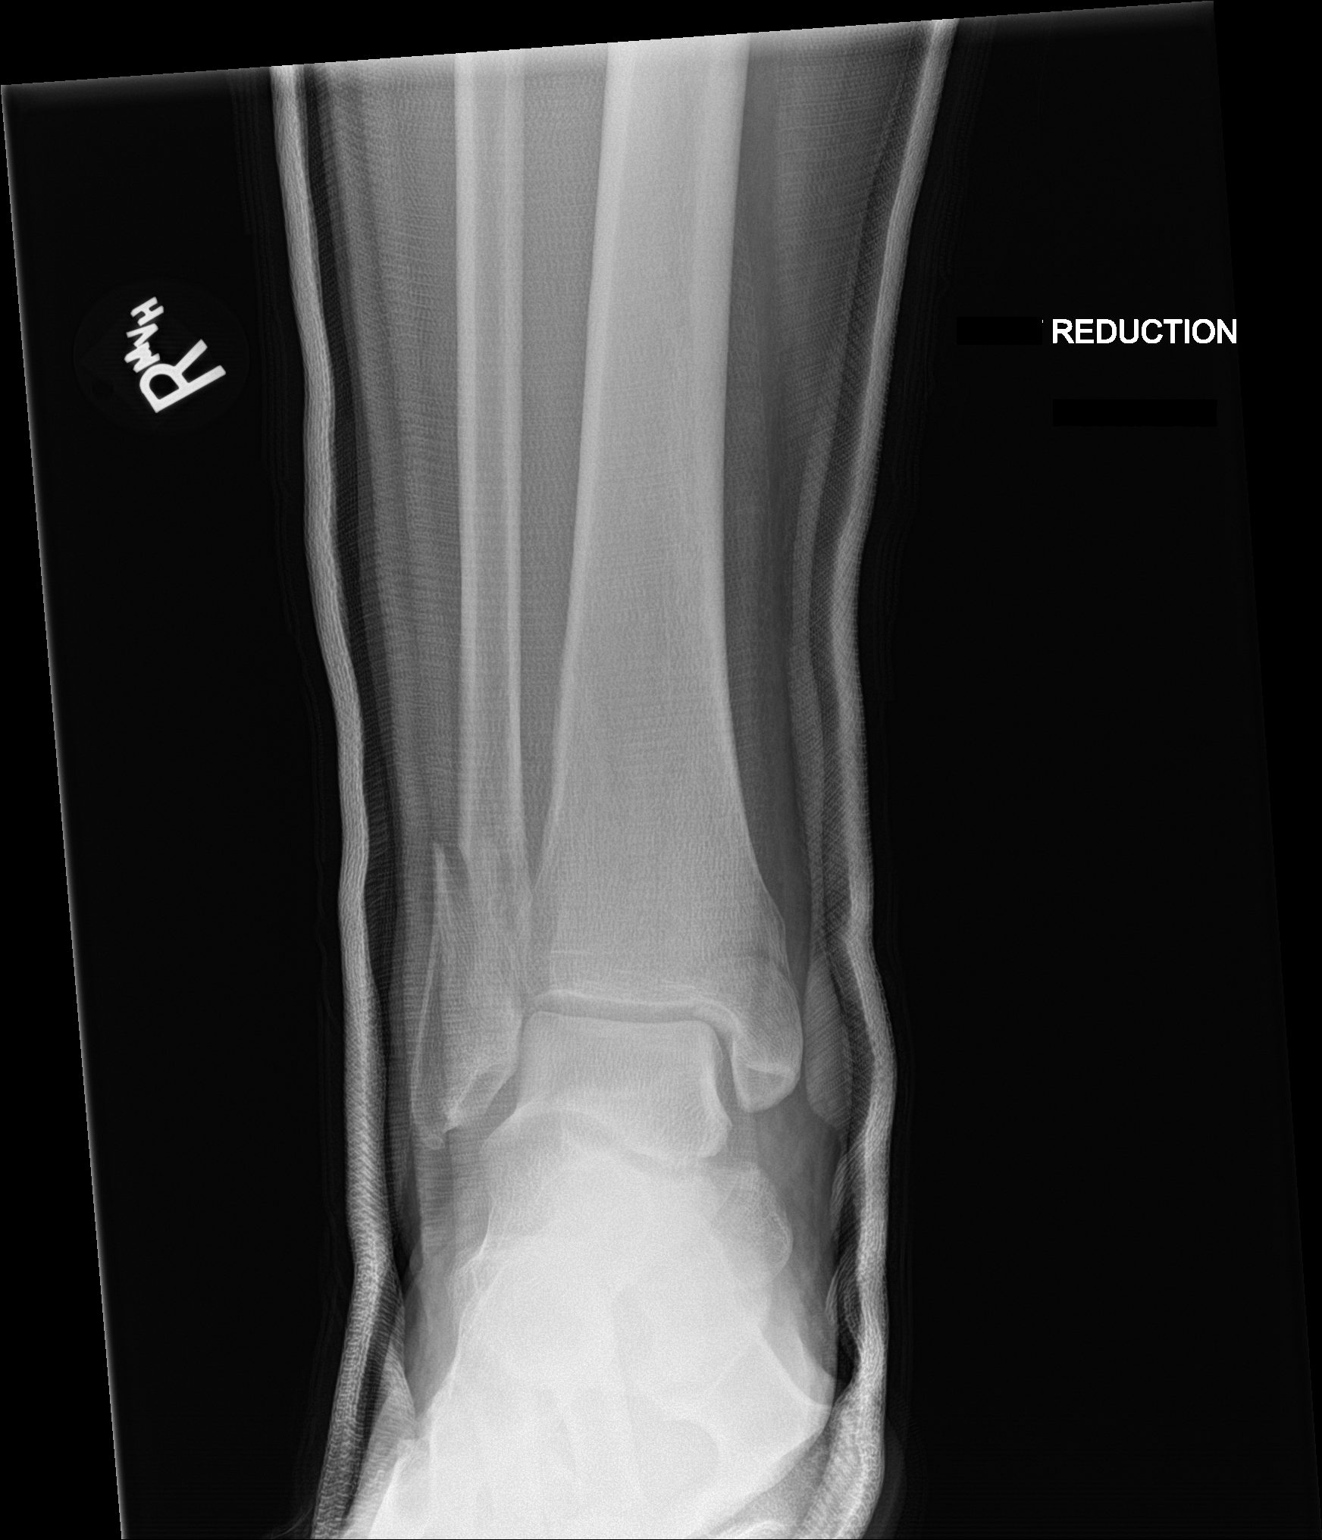

[2 of 2 positions shown; findings below may reference images not displayed]

FINDINGS: The right ankle has been splinted and immobilized. There is
significantly improved alignment of the distal right fibular and
medial malleolar fractures compared to prior exam. Talotibial
dislocation has been reduced.
IMPRESSION: Reduction of talotibial dislocation. Significantly improved
alignment of distal right fibular and medial malleolar fractures.

## 2021-10-11 MED ORDER — HYDROMORPHONE HCL 1 MG/ML IJ SOLN: 0.5000 mg | INTRAMUSCULAR | Status: DC | PRN

## 2021-10-11 MED ORDER — PROPOFOL 10 MG/ML IV BOLUS
0.5000 mg/kg | Freq: Once | INTRAVENOUS | Status: AC
  Administered 2021-10-11: 130 mg via INTRAVENOUS
  Filled 2021-10-11: qty 20

## 2021-10-11 MED ORDER — IBUPROFEN 200 MG PO TABS: 800.0000 mg | ORAL_TABLET | Freq: Four times a day (QID) | ORAL | Status: DC | PRN

## 2021-10-11 MED ORDER — SODIUM CHLORIDE 0.9 % IV BOLUS
1000.0000 mL | Freq: Once | INTRAVENOUS | Status: AC
  Administered 2021-10-11: 1000 mL via INTRAVENOUS

## 2021-10-11 MED ORDER — HYDROCODONE-ACETAMINOPHEN 5-325 MG PO TABS: 1.0000 | ORAL_TABLET | ORAL | Status: DC | PRN

## 2021-10-11 NOTE — ED Triage Notes (Signed)
Pt bib ems for fall. Pt has obvious deformity to right ankle. Pt arrives w/ field splint in place by ems. Pt given 146mcg fentanyl by ems. Pt has +2 palpable pedal pulses.  Pt vitally stable and in NAD upon arrival.

## 2021-10-11 NOTE — ED Notes (Signed)
Right Ankle Reduction:  1844: Time out w/ pt, MD Regenia Skeeter, Marylyn Ishihara., RN, Resp therapist x2, Ortho Tech.   1845: 45mg  Propofol pushed 1847: 45mg  Propofol pushed 1848: 40mg  Propofol pushed  1850: MD [place] reduced pt's right ankle 1851: [place] tech splinted pt's right ankle.   1855: Pt awake post sedation.    See EMR for vitals.

## 2021-10-11 NOTE — Progress Notes (Signed)
Orthopedic Tech Progress Note Patient Details:  Nicole Hunt 1961/02/09 [id number]  Ortho Devices Type of Ortho Device: Short leg splint, Stirrup splint, Crutches Ortho Device/Splint Location: RLE Ortho Device/Splint Interventions: Application, Adjustment   Post Interventions Patient Tolerated: Well  [name] 10/11/2021, 7:00 PM

## 2021-10-11 NOTE — Discharge Instructions (Addendum)
Nicole Hang, MD [facility]  Please read the following information regarding your care after surgery.  All [facility] which [facility] open 24/7  [id number] [place], [place], [place] [address], Ph [phone], Fax [phone]  Medications  You only need a prescription for the narcotic pain medicine (ex. oxycodone, Percocet, Norco).  All of the other medicines listed below are available over the counter. ? Aleve 2 pills twice a day for the first 3 days after surgery. ? acetominophen (Tylenol) 650 mg every 4-6 hours as you need for minor to moderate pain ? oxycodone as prescribed for severe pain  ? To help prevent blood clots, take aspirin (325 mg) twice daily for 30 days after surgery.  You should also get up every hour while you are awake to move around.  Weight Bearing ? Do NOT bear any weight on the operated leg or foot.  Cast / Splint / Dressing ? If you have a splint, do NOT remove this. Keep your splint, cast or dressing clean and dry.  Dont put anything (coat hanger, pencil, etc) down inside of it.  If it gets wet, call the office immediately to schedule an appointment for a cast change.  Swelling IMPORTANT: It is normal for you to have swelling where you had surgery. To reduce swelling and pain, keep at least 3 pillows under your leg so that your toes are above your nose and your heel is above the level of your hip.  It may be necessary to keep your foot or leg elevated for several weeks.  This is critical to helping your incisions heal and your pain to feel better.  Follow Up Call my office at [phone] when you are discharged from the hospital or surgery center to schedule an appointment to be seen 1-2 weeks after surgery.  Call my office at [phone] if you develop a fever >101.5 F, nausea, vomiting, bleeding from the surgical site or severe pain.

## 2021-10-11 NOTE — Care Plan (Signed)
ORTHOPAEDIC SURGERY Plan of Care  -notified by Dr. Rolena Infante about this patient in [facility] ED with right trimalleolar ankle fracture dislocation s/p closed reduction -d/w ER and OR control desk. Will plan for transfer overnight to [facility] for OR Saturday 10/12/21 -plan for Right ankle open reduction internal fixation, possible syndesmosis fixation and possible external fixation with delayed definitive fixation of right ankle -full consult note to follow  Armond Hang, MD Orthopaedic Surgery [facility]

## 2021-10-11 NOTE — ED Provider Notes (Signed)
[facility] DEPT Provider Note   CSN: [id number] Arrival date & time: 10/11/21  1742     History  Chief Complaint  Patient presents with   Ankle Pain   Ankle Injury    Haylea Schlichting is a 61 y.o. female.  HPI 61 year old female presents with right ankle injury.  She was walking her dog down a downslope and lost her balance and thinks she twisted her right ankle.  EMS brought her in with an ankle deformity.  Has had intact pulses.  She has been given 100 mcg IV fentanyl.  Patient denies any other injuries.  Last ate around noon.  Home Medications Prior to Admission medications   Medication Sig Start Date End Date Taking? Authorizing Provider  amLODipine (NORVASC) 5 MG tablet Take 1 tablet (5 mg total) by mouth daily. 08/07/21 11/05/21  Bary Castilla, NP  losartan (COZAAR) 25 MG tablet Take 25 mg by mouth daily. 06/11/21   [provider]  Magnesium 250 MG TABS Take 1 tablet (250 mg total) by mouth every evening. Take with evening meal. 05/03/20   Minette Brine, FNP      Allergies    Patient has no known allergies.    Review of Systems   Review of Systems  Musculoskeletal:  Positive for arthralgias.  Neurological:  Negative for weakness, numbness and headaches.   Physical Exam Updated Vital Signs BP (!) 150/85    Pulse 83    Temp 98.1 F (36.7 C) (Oral)    Resp (!) 21    Ht 5\' 9"  (1.753 m)    Wt 90.7 kg    SpO2 100%    BMI 29.53 kg/m  Physical Exam Vitals and nursing note reviewed.  Constitutional:      General: She is not in acute distress.    Appearance: She is well-developed. She is not ill-appearing or diaphoretic.  HENT:     Head: Normocephalic and atraumatic.  Cardiovascular:     Rate and Rhythm: Normal rate and regular rhythm.     Pulses:          Dorsalis pedis pulses are 2+ on the right side and 2+ on the left side.  Pulmonary:     Effort: Pulmonary effort is normal.  Abdominal:     General: There is no  distension.  Musculoskeletal:     Right ankle: Deformity present. Tenderness present.     Comments: Right ankle deformity. Mild tenting of the skin. No lacerations. Able to wiggle toes. Some mild decreased sensation in right foot but she states it's fairly similar to left  Skin:    General: Skin is warm and dry.  Neurological:     Mental Status: She is alert.    ED Results / Procedures / Treatments   Labs (all labs ordered are listed, but only abnormal results are displayed) Labs Reviewed  CBC WITH DIFFERENTIAL/PLATELET - Abnormal; Notable for the following components:      Result Value   Hemoglobin 11.8 (*)    All other components within normal limits  BASIC METABOLIC PANEL - Abnormal; Notable for the following components:   Glucose, Bld 103 (*)    Creatinine, Ser 1.08 (*)    GFR, Estimated 60 (*)    All other components within normal limits  RESP PANEL BY RT-PCR (FLU A&B, COVID) [id number]    EKG EKG Interpretation  Date/Time:  Friday October 11 2021 21:37:42 EST Ventricular Rate:  87 PR Interval:  162 QRS Duration: 92 QT  Interval:  358 QTC Calculation: 431 R Axis:   60 Text Interpretation: Sinus rhythm Borderline T abnormalities, anterior leads No old tracing to compare Confirmed by Sherwood Gambler [phone]) on 10/11/2021 10:24:20 PM  Radiology CT Ankle Right Wo Contrast  Result Date: 10/11/2021 CLINICAL DATA:  Known fracture dislocation, status post reduction EXAM: CT OF THE RIGHT ANKLE WITHOUT CONTRAST TECHNIQUE: Multidetector CT imaging of the right ankle was performed according to the standard protocol. Multiplanar CT image reconstructions were also generated. RADIATION DOSE REDUCTION: This exam was performed according to the departmental dose-optimization program which includes automated exposure control, adjustment of the mA and/or kV according to patient size and/or use of iterative reconstruction technique. COMPARISON:  Films from earlier in the same day. FINDINGS:  Bones/Joint/Cartilage Oblique fracture through the distal fibular metaphysis is noted similar to that seen on recent plain film examination. Mild comminution is seen. The medial malleolar fracture is again identified with mild lateral displacement of the distal fracture fragment with respect to the distal tibia. Small avulsion from the posterior malleolus is noted. The talus is well seated but remains mildly laterally displaced with respect to the distal tibia. No tarsal fractures are seen. Calcaneus appears within normal limits. Ligaments Suboptimally assessed by CT. Muscles and Tendons Musculature appears within normal limits. Soft tissues Soft tissue swelling is noted without focal hematoma. IMPRESSION: Trimalleolar fracture with only minimal displacement of the fracture fragments and persistent mild lateral displacement of the talus with respect to the distal tibia. Electronically Signed   By: Inez Catalina M.D.   On: 10/11/2021 20:47   DG Ankle Right Port  Result Date: 10/11/2021 CLINICAL DATA:  Status post reduction of ankle fracture. EXAM: PORTABLE RIGHT ANKLE - 2 VIEW COMPARISON:  Same day. FINDINGS: The right ankle has been splinted and immobilized. There is significantly improved alignment of the distal right fibular and medial malleolar fractures compared to prior exam. Talotibial dislocation has been reduced. IMPRESSION: Reduction of talotibial dislocation. Significantly improved alignment of distal right fibular and medial malleolar fractures. Electronically Signed   By: Marijo Conception M.D.   On: 10/11/2021 19:11   DG Ankle Right Port  Result Date: 10/11/2021 CLINICAL DATA:  Fall. EXAM: PORTABLE RIGHT ANKLE - 2 VIEW COMPARISON:  None. FINDINGS: There is an acute oblique fracture through the lateral malleolus at the level of the ankle mortise. There is apex anteromedial angulation with 1 shaft with anteromedial displacement of the proximal fracture fragment. There is an acute transverse fracture  through the medial malleolus. There is 1 shaft with medial and anterior dislocation of the distal tibia in relation to the talar dome. IMPRESSION: 1. Markedly displaced fractures of the distal fibula and medial malleolus. 2. Anteromedial dislocation of the tibia in relation to the talar dome. Electronically Signed   By: Ronney Asters M.D.   On: 10/11/2021 18:34    Procedures .Sedation  Date/Time: 10/11/2021 7:01 PM Performed by: Sherwood Gambler, MD Authorized by: Sherwood Gambler, MD   Consent:    Consent obtained:  Written and verbal   Consent given by:  Patient Universal protocol:    Immediately prior to procedure, a time out was called: yes     Patient identity confirmed:  Verbally with patient Indications:    Procedure performed:  Fracture reduction   Procedure necessitating sedation performed by:  Physician performing sedation Pre-sedation assessment:    Time since last food or drink:  6 hours   NPO status caution: urgency dictates proceeding with non-ideal NPO status  ASA classification: class 2 - patient with mild systemic disease     Mallampati score:  III - soft palate, base of uvula visible   Pre-sedation assessments completed and reviewed: airway patency, cardiovascular function, hydration status, mental status, nausea/vomiting, pain level, respiratory function and temperature   Immediate pre-procedure details:    Reviewed: vital signs, relevant labs/tests and NPO status     Verified: bag valve mask available, emergency equipment available, intubation equipment available, IV patency confirmed, oxygen available, reversal medications available and suction available   Procedure details (see MAR for exact dosages):    Preoxygenation:  Nasal cannula   Sedation:  Propofol   Intended level of sedation: deep   Intra-procedure monitoring:  Blood pressure monitoring, cardiac monitor, continuous pulse oximetry, continuous capnometry, frequent LOC assessments and frequent vital sign  checks   Intra-procedure events: none     Total Provider sedation time (minutes):  12 Post-procedure details:    Attendance: Constant attendance by certified staff until patient recovered     Recovery: Patient returned to pre-procedure baseline     Post-sedation assessments completed and reviewed: airway patency, cardiovascular function, hydration status, mental status, nausea/vomiting, pain level, respiratory function and temperature     Patient is stable for discharge or admission: yes     Procedure completion:  Tolerated well, no immediate complications Reduction of fracture  Date/Time: 10/11/2021 7:02 PM Performed by: Sherwood Gambler, MD Authorized by: Sherwood Gambler, MD  Consent: Verbal consent obtained. Written consent obtained. Risks and benefits: risks, benefits and alternatives were discussed Consent given by: patient Required items: required blood products, implants, devices, and special equipment available Patient identity confirmed: verbally with patient Time out: Immediately prior to procedure a "time out" was called to verify the correct patient, procedure, equipment, support staff and site/side marked as required. Preparation: Patient was prepped and draped in the usual sterile fashion.  Sedation: Patient sedated: yes  Patient tolerance: patient tolerated the procedure well with no immediate complications Comments: Fracture reduced without difficulty. 2+ DP pulse afterwards. When she woke up she can easily move toes      Medications Ordered in ED Medications  HYDROmorphone (DILAUDID) injection 0.5 mg (has no administration in time range)  HYDROcodone-acetaminophen (NORCO/VICODIN) 5-325 MG per tablet 1 tablet (has no administration in time range)  ibuprofen (ADVIL) tablet 800 mg (has no administration in time range)  sodium chloride 0.9 % bolus 1,000 mL (0 mLs Intravenous Stopped 10/11/21 1939)  propofol (DIPRIVAN) 10 mg/mL bolus/IV push 45.4 mg (130 mg Intravenous  Given 10/11/21 1854)    ED Course/ Medical Decision Making/ A&P                           Medical Decision Making Amount and/or Complexity of Data Reviewed Labs: ordered. Radiology: ordered.  Risk Prescription drug management. Decision regarding hospitalization.   Patient presents with right ankle deformity after a fall. Has fracture dislocation that is apparent on initial xray images, which I have viewed/interpreted.  She remains neurovascularly intact.  After consent, procedural sedation was performed as above and her ankle was reduced.  It was then splinted.  Good pulse after the reduction and is neurovascular intact when she has awoken.  No other signs of trauma.  She has a history of hypertension but no other significant past medical history.  I have reviewed the images post reduction and there is good reduction with some minimal displacement.  I discussed case with Dr. Rolena Infante who asked  for CT scan of the ankle.  After this we discussed again and he has let his partner, Dr. Kathaleen Bury know.  Dr. Kathaleen Bury has discussed with me as well and he is operating tomorrow and is able to fit her on the schedule if she is willing to get surgery.  She is willing and so she will be admitted to his service over at [facility].  She has declined further pain medicines in the ED.  As needed meds have been ordered.  She otherwise has had screening labs which include mild anemia and otherwise stable CBC/BMP compared to baseline.  Preop ECG shows some nonspecific T waves, no old to compare to on my view.  Admit to [facility].        Final Clinical Impression(s) / ED Diagnoses Final diagnoses:  Fall  Closed trimalleolar fracture of right ankle, initial encounter    Rx / DC Orders ED Discharge Orders     None         Sherwood Gambler, MD 10/11/21 2227

## 2021-10-12 ENCOUNTER — Observation Stay (HOSPITAL_COMMUNITY): Admitting: Anesthesiology

## 2021-10-12 ENCOUNTER — Observation Stay (HOSPITAL_COMMUNITY)

## 2021-10-12 ENCOUNTER — Encounter (HOSPITAL_COMMUNITY): Payer: Self-pay | Admitting: Orthopaedic Surgery

## 2021-10-12 ENCOUNTER — Other Ambulatory Visit: Payer: Self-pay

## 2021-10-12 ENCOUNTER — Encounter (HOSPITAL_COMMUNITY)
Admission: EM | Disposition: A | Discharge status: Home / Self Care | Payer: Self-pay | Source: Home / Self Care | Attending: Emergency Medicine

## 2021-10-12 DIAGNOSIS — T148XXA Other injury of unspecified body region, initial encounter: Secondary | ICD-10-CM

## 2021-10-12 DIAGNOSIS — S82851A Displaced trimalleolar fracture of right lower leg, initial encounter for closed fracture: Secondary | ICD-10-CM | POA: Diagnosis not present

## 2021-10-12 DIAGNOSIS — I129 Hypertensive chronic kidney disease with stage 1 through stage 4 chronic kidney disease, or unspecified chronic kidney disease: Secondary | ICD-10-CM | POA: Diagnosis not present

## 2021-10-12 DIAGNOSIS — N1831 Chronic kidney disease, stage 3a: Secondary | ICD-10-CM | POA: Diagnosis not present

## 2021-10-12 DIAGNOSIS — Z20822 Contact with and (suspected) exposure to covid-19: Secondary | ICD-10-CM | POA: Diagnosis not present

## 2021-10-12 DIAGNOSIS — X501XXA Overexertion from prolonged static or awkward postures, initial encounter: Secondary | ICD-10-CM | POA: Diagnosis not present

## 2021-10-12 DIAGNOSIS — S99911A Unspecified injury of right ankle, initial encounter: Secondary | ICD-10-CM | POA: Diagnosis present

## 2021-10-12 DIAGNOSIS — Z79899 Other long term (current) drug therapy: Secondary | ICD-10-CM | POA: Diagnosis not present

## 2021-10-12 HISTORY — DX: Other injury of unspecified body region, initial encounter: T14.8XXA

## 2021-10-12 HISTORY — PX: ORIF ANKLE FRACTURE: SHX5408

## 2021-10-12 LAB — SURGICAL PCR SCREEN
MRSA, PCR: NEGATIVE
Staphylococcus aureus: NEGATIVE

## 2021-10-12 LAB — HIV ANTIBODY (ROUTINE TESTING W REFLEX): HIV Screen 4th Generation wRfx: NONREACTIVE

## 2021-10-12 IMAGING — RF DG ANKLE 2V *R*
1 series · 3 of 3 positions shown · non-contrast
Comparison: [DATE]

CLINICAL DATA: Right ankle ORIF

EXAM:
RIGHT ANKLE - 2 VIEW; DG C-ARM 1-60 MIN-NO REPORT

[Series 1: run · 3 of 3 slices shown]
[im 1/3]
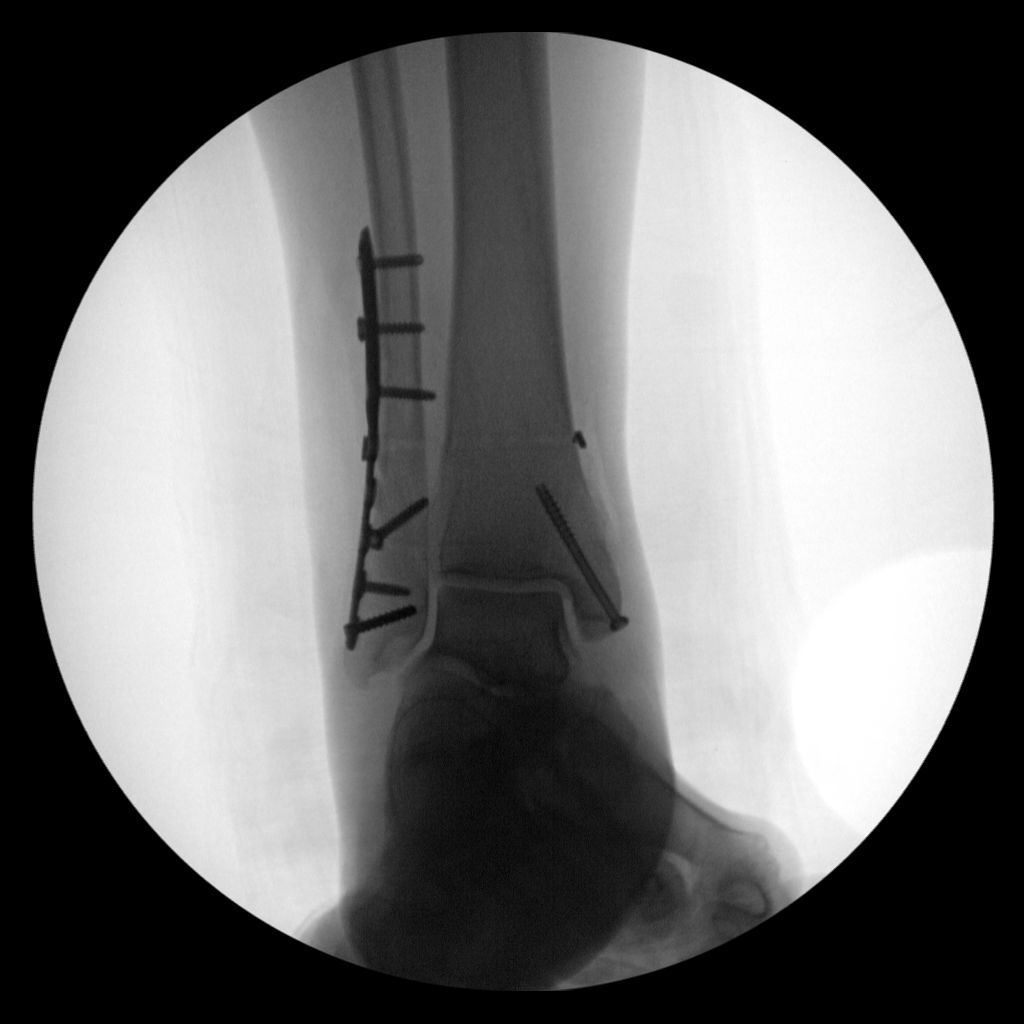
[im 2/3]
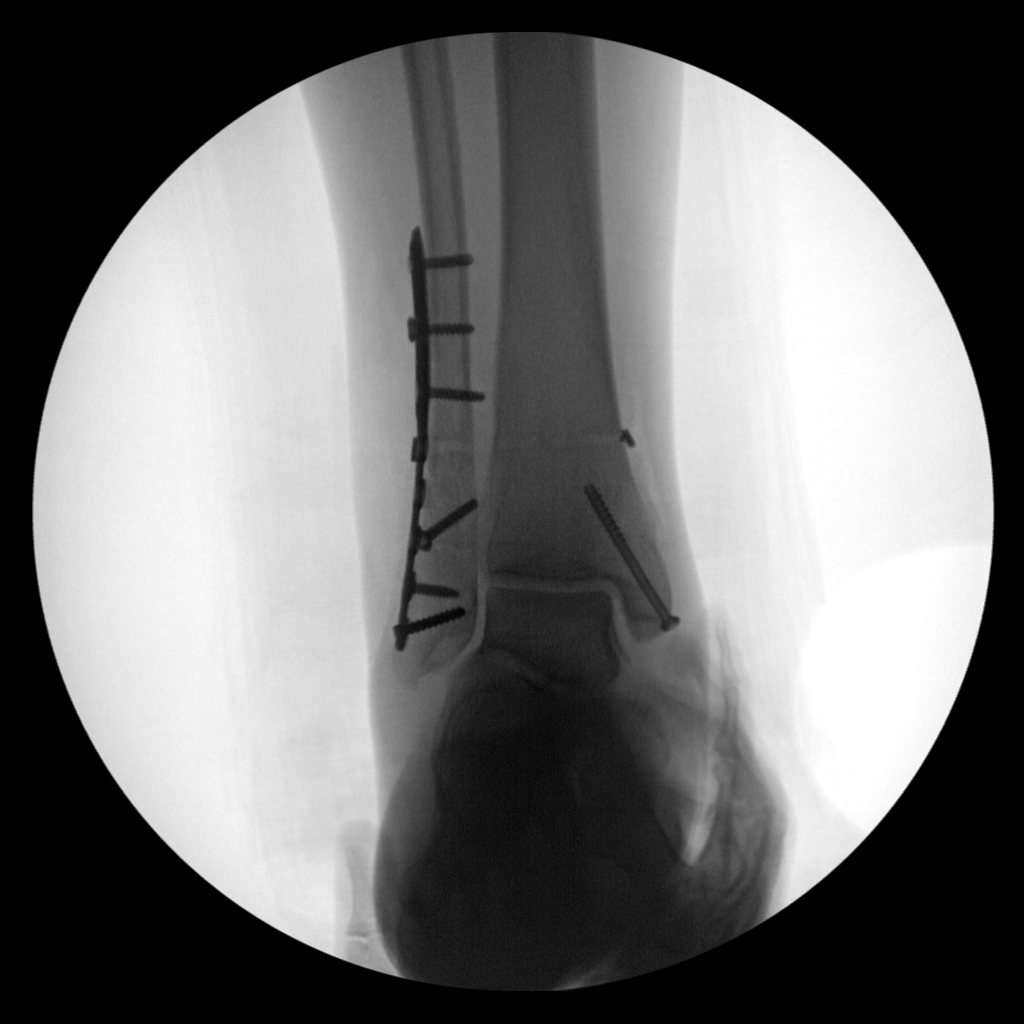
[im 3/3]
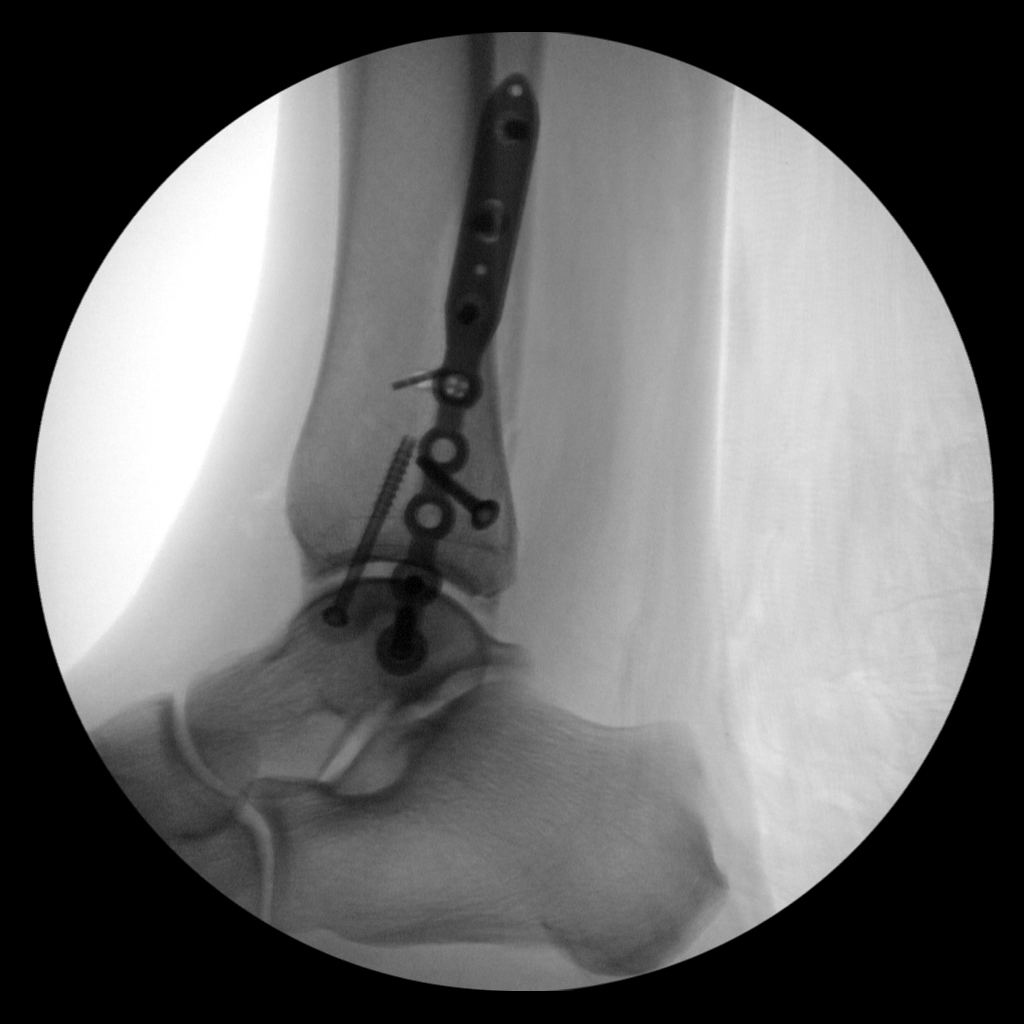

[3 of 3 positions shown; findings below may reference images not displayed]

FINDINGS: Three C-arm fluoroscopic images were obtained intraoperatively and
submitted for post operative interpretation. Images obtained during
ORIF of trimalleolar right ankle fracture. 2.3 minutes of
fluoroscopy time utilized. Please see the performing provider's
procedural report for further detail.
IMPRESSION: As above.

## 2021-10-12 SURGERY — OPEN REDUCTION INTERNAL FIXATION (ORIF) ANKLE FRACTURE
Anesthesia: General | Site: Ankle | Laterality: Right

## 2021-10-12 MED ORDER — DEXAMETHASONE SODIUM PHOSPHATE 10 MG/ML IJ SOLN
INTRAMUSCULAR | Status: DC | PRN
  Administered 2021-10-12: 4 mg via INTRAVENOUS

## 2021-10-12 MED ORDER — MIDAZOLAM HCL 2 MG/2ML IJ SOLN: 1.0000 mg | Freq: Once | INTRAMUSCULAR | Status: AC

## 2021-10-12 MED ORDER — DEXAMETHASONE SODIUM PHOSPHATE 10 MG/ML IJ SOLN
INTRAMUSCULAR | Status: AC
  Filled 2021-10-12: qty 1

## 2021-10-12 MED ORDER — FENTANYL CITRATE (PF) 100 MCG/2ML IJ SOLN
INTRAMUSCULAR | Status: AC
  Administered 2021-10-12: 100 ug via INTRAVENOUS
  Filled 2021-10-12: qty 2

## 2021-10-12 MED ORDER — LACTATED RINGERS IV SOLN: INTRAVENOUS | Status: DC

## 2021-10-12 MED ORDER — CLONIDINE HCL (ANALGESIA) 100 MCG/ML EP SOLN
EPIDURAL | Status: DC | PRN
  Administered 2021-10-12: 50 ug
  Administered 2021-10-12: 30 ug

## 2021-10-12 MED ORDER — ONDANSETRON HCL 4 MG/2ML IJ SOLN: 4.0000 mg | Freq: Four times a day (QID) | INTRAMUSCULAR | Status: DC | PRN

## 2021-10-12 MED ORDER — CHLORHEXIDINE GLUCONATE 0.12 % MT SOLN
OROMUCOSAL | Status: AC
  Administered 2021-10-12: 15 mL via OROMUCOSAL
  Filled 2021-10-12: qty 15

## 2021-10-12 MED ORDER — PHENYLEPHRINE HCL-NACL 20-0.9 MG/250ML-% IV SOLN
INTRAVENOUS | Status: DC | PRN
Start: 2021-10-12 — End: 2021-10-12
  Administered 2021-10-12: 25 ug/min via INTRAVENOUS

## 2021-10-12 MED ORDER — CHLORHEXIDINE GLUCONATE 4 % EX LIQD
60.0000 mL | Freq: Once | CUTANEOUS | Status: AC
  Administered 2021-10-12: 4 via TOPICAL
  Filled 2021-10-12: qty 60

## 2021-10-12 MED ORDER — MUPIROCIN 2 % EX OINT: 1.0000 "application " | TOPICAL_OINTMENT | Freq: Two times a day (BID) | CUTANEOUS | Status: DC

## 2021-10-12 MED ORDER — VANCOMYCIN HCL 500 MG IV SOLR
INTRAVENOUS | Status: DC | PRN
  Administered 2021-10-12: 500 mg via TOPICAL

## 2021-10-12 MED ORDER — ONDANSETRON HCL 4 MG/2ML IJ SOLN
INTRAMUSCULAR | Status: AC
  Filled 2021-10-12: qty 2

## 2021-10-12 MED ORDER — EPHEDRINE 5 MG/ML INJ
INTRAVENOUS | Status: AC
  Filled 2021-10-12: qty 5

## 2021-10-12 MED ORDER — HYDROCODONE-ACETAMINOPHEN 7.5-325 MG PO TABS: 1.0000 | ORAL_TABLET | ORAL | Status: DC | PRN

## 2021-10-12 MED ORDER — POVIDONE-IODINE 10 % EX SWAB
2.0000 "application " | Freq: Once | CUTANEOUS | Status: AC
  Administered 2021-10-12: 2 via TOPICAL

## 2021-10-12 MED ORDER — GLYCOPYRROLATE PF 0.2 MG/ML IJ SOSY
PREFILLED_SYRINGE | INTRAMUSCULAR | Status: DC | PRN
  Administered 2021-10-12 (×2): .1 mg via INTRAVENOUS

## 2021-10-12 MED ORDER — ONDANSETRON HCL 4 MG/2ML IJ SOLN
INTRAMUSCULAR | Status: DC | PRN
  Administered 2021-10-12: 4 mg via INTRAVENOUS

## 2021-10-12 MED ORDER — MIDAZOLAM HCL 2 MG/2ML IJ SOLN
INTRAMUSCULAR | Status: AC
  Administered 2021-10-12: 1 mg via INTRAVENOUS
  Filled 2021-10-12: qty 2

## 2021-10-12 MED ORDER — HYDROCODONE-ACETAMINOPHEN 5-325 MG PO TABS
1.0000 | ORAL_TABLET | ORAL | Status: DC | PRN
  Administered 2021-10-12: 2 via ORAL
  Administered 2021-10-12: 1 via ORAL
  Filled 2021-10-12: qty 1
  Filled 2021-10-12: qty 2

## 2021-10-12 MED ORDER — DOCUSATE SODIUM 100 MG PO CAPS: 100.0000 mg | ORAL_CAPSULE | Freq: Two times a day (BID) | ORAL | Status: DC

## 2021-10-12 MED ORDER — SODIUM CHLORIDE 0.9 % IV SOLN: INTRAVENOUS | Status: DC

## 2021-10-12 MED ORDER — FENTANYL CITRATE (PF) 100 MCG/2ML IJ SOLN: 100.0000 ug | Freq: Once | INTRAMUSCULAR | Status: AC

## 2021-10-12 MED ORDER — PROMETHAZINE HCL 25 MG/ML IJ SOLN: 6.2500 mg | INTRAMUSCULAR | Status: DC | PRN

## 2021-10-12 MED ORDER — FENTANYL CITRATE (PF) 250 MCG/5ML IJ SOLN
INTRAMUSCULAR | Status: AC
  Filled 2021-10-12: qty 5

## 2021-10-12 MED ORDER — CHLORHEXIDINE GLUCONATE 0.12 % MT SOLN: 15.0000 mL | Freq: Once | OROMUCOSAL | Status: AC

## 2021-10-12 MED ORDER — CEFAZOLIN SODIUM-DEXTROSE 2-4 GM/100ML-% IV SOLN
2.0000 g | INTRAVENOUS | Status: DC
  Filled 2021-10-12: qty 100

## 2021-10-12 MED ORDER — MORPHINE SULFATE (PF) 2 MG/ML IV SOLN
0.5000 mg | INTRAVENOUS | Status: DC | PRN
  Administered 2021-10-12: 1 mg via INTRAVENOUS
  Filled 2021-10-12: qty 1

## 2021-10-12 MED ORDER — CEFAZOLIN SODIUM-DEXTROSE 2-3 GM-%(50ML) IV SOLR
INTRAVENOUS | Status: DC | PRN
  Administered 2021-10-12: 2 g via INTRAVENOUS

## 2021-10-12 MED ORDER — MEPERIDINE HCL 25 MG/ML IJ SOLN: 6.2500 mg | INTRAMUSCULAR | Status: DC | PRN

## 2021-10-12 MED ORDER — PHENYLEPHRINE 40 MCG/ML (10ML) SYRINGE FOR IV PUSH (FOR BLOOD PRESSURE SUPPORT)
PREFILLED_SYRINGE | INTRAVENOUS | Status: DC | PRN
  Administered 2021-10-12: 40 ug via INTRAVENOUS
  Administered 2021-10-12 (×2): 80 ug via INTRAVENOUS
  Administered 2021-10-12: 40 ug via INTRAVENOUS

## 2021-10-12 MED ORDER — FENTANYL CITRATE (PF) 250 MCG/5ML IJ SOLN
INTRAMUSCULAR | Status: DC | PRN
  Administered 2021-10-12: 25 ug via INTRAVENOUS

## 2021-10-12 MED ORDER — ACETAMINOPHEN 500 MG PO TABS
500.0000 mg | ORAL_TABLET | Freq: Four times a day (QID) | ORAL | Status: DC
  Filled 2021-10-12: qty 1

## 2021-10-12 MED ORDER — VANCOMYCIN HCL 500 MG IV SOLR
INTRAVENOUS | Status: AC
  Filled 2021-10-12: qty 10

## 2021-10-12 MED ORDER — OXYCODONE HCL 5 MG PO TABS: 5.0000 mg | ORAL_TABLET | ORAL | 0 refills | Status: DC | PRN

## 2021-10-12 MED ORDER — ROPIVACAINE HCL 5 MG/ML IJ SOLN
INTRAMUSCULAR | Status: DC | PRN
Start: 2021-10-12 — End: 2021-10-12
  Administered 2021-10-12: 27 mL via PERINEURAL
  Administered 2021-10-12: 18 mL via PERINEURAL

## 2021-10-12 MED ORDER — LACTATED RINGERS IV SOLN: INTRAVENOUS | Status: DC | PRN

## 2021-10-12 MED ORDER — DEXAMETHASONE SODIUM PHOSPHATE 4 MG/ML IJ SOLN
INTRAMUSCULAR | Status: DC | PRN
  Administered 2021-10-12: 3 mg via PERINEURAL
  Administered 2021-10-12: 2 mg via PERINEURAL

## 2021-10-12 MED ORDER — ONDANSETRON HCL 4 MG PO TABS: 4.0000 mg | ORAL_TABLET | Freq: Four times a day (QID) | ORAL | Status: DC | PRN

## 2021-10-12 MED ORDER — PROPOFOL 10 MG/ML IV BOLUS
INTRAVENOUS | Status: DC | PRN
  Administered 2021-10-12: 50 mg via INTRAVENOUS
  Administered 2021-10-12: 150 mg via INTRAVENOUS

## 2021-10-12 MED ORDER — MIDAZOLAM HCL 2 MG/2ML IJ SOLN
INTRAMUSCULAR | Status: AC
  Filled 2021-10-12: qty 2

## 2021-10-12 MED ORDER — PHENYLEPHRINE 40 MCG/ML (10ML) SYRINGE FOR IV PUSH (FOR BLOOD PRESSURE SUPPORT)
PREFILLED_SYRINGE | INTRAVENOUS | Status: AC
  Filled 2021-10-12: qty 20

## 2021-10-12 MED ORDER — ACETAMINOPHEN 325 MG PO TABS: 325.0000 mg | ORAL_TABLET | Freq: Four times a day (QID) | ORAL | Status: DC | PRN

## 2021-10-12 MED ORDER — 0.9 % SODIUM CHLORIDE (POUR BTL) OPTIME
TOPICAL | Status: DC | PRN
  Administered 2021-10-12: 1000 mL

## 2021-10-12 MED ORDER — HYDROMORPHONE HCL 1 MG/ML IJ SOLN: 0.2500 mg | INTRAMUSCULAR | Status: DC | PRN

## 2021-10-12 SURGICAL SUPPLY — 56 items
BIT DRILL 2.5X2.75 QC CALB (BIT) ×1 IMPLANT
BIT DRILL 2.9 CANN QC NONSTRL (BIT) ×1 IMPLANT
BIT DRILL 3.5X5.5 QC CALB (BIT) ×1 IMPLANT
BLADE SURG 15 STRL LF DISP TIS (BLADE) IMPLANT
BLADE SURG 15 STRL SS (BLADE) ×1
BNDG COHESIVE 4X5 TAN ST LF (GAUZE/BANDAGES/DRESSINGS) ×2 IMPLANT
BNDG ELASTIC 4X5.8 VLCR STR LF (GAUZE/BANDAGES/DRESSINGS) ×2 IMPLANT
BNDG ELASTIC 6X5.8 VLCR STR LF (GAUZE/BANDAGES/DRESSINGS) ×2 IMPLANT
BNDG GAUZE ELAST 4 BULKY (GAUZE/BANDAGES/DRESSINGS) ×1 IMPLANT
CHLORAPREP W/TINT 26 (MISCELLANEOUS) ×4 IMPLANT
COVER SURGICAL LIGHT HANDLE (MISCELLANEOUS) ×2 IMPLANT
CUFF TOURN SGL QUICK 34 (TOURNIQUET CUFF) ×1
CUFF TRNQT CYL 34X4.125X (TOURNIQUET CUFF) ×1 IMPLANT
DRAPE C-ARM 42X120 X-RAY (DRAPES) ×1 IMPLANT
DRAPE C-ARMOR (DRAPES) ×2 IMPLANT
DRAPE U-SHAPE 47X51 STRL (DRAPES) ×2 IMPLANT
DRSG PAD ABDOMINAL 8X10 ST (GAUZE/BANDAGES/DRESSINGS) ×5 IMPLANT
ELECT REM PT RETURN 15FT ADLT (MISCELLANEOUS) ×2 IMPLANT
FIXATION ZIPTIGHT ANKLE SNDSMS (Ankle) IMPLANT
GAUZE SPONGE 4X4 12PLY STRL (GAUZE/BANDAGES/DRESSINGS) ×4 IMPLANT
GAUZE XEROFORM 5X9 LF (GAUZE/BANDAGES/DRESSINGS) ×2 IMPLANT
GLOVE SURG ENC MOIS LTX SZ7.5 (GLOVE) ×5 IMPLANT
GLOVE SURG UNDER POLY LF SZ7.5 (GLOVE) ×4 IMPLANT
K-WIRE ACE 1.6X6 (WIRE) ×8
KIT BASIN OR (CUSTOM PROCEDURE TRAY) ×2 IMPLANT
KIT TURNOVER KIT A (KITS) ×1 IMPLANT
KWIRE ACE 1.6X6 (WIRE) IMPLANT
NS IRRIG 1000ML POUR BTL (IV SOLUTION) ×2 IMPLANT
PACK ORTHO EXTREMITY (CUSTOM PROCEDURE TRAY) ×2 IMPLANT
PAD ABD 8X10 STRL (GAUZE/BANDAGES/DRESSINGS) ×5 IMPLANT
PAD CAST 4YDX4 CTTN HI CHSV (CAST SUPPLIES) ×6 IMPLANT
PADDING CAST COTTON 4X4 STRL (CAST SUPPLIES) ×6
PADDING CAST COTTON 6X4 STRL (CAST SUPPLIES) ×1 IMPLANT
PLATE LOCK 8H 103 BILAT FIB (Plate) ×1 IMPLANT
SCREW ACE CAN 4.0 42M (Screw) ×1 IMPLANT
SCREW CORTICAL 3.5MM  16MM (Screw) ×1 IMPLANT
SCREW CORTICAL 3.5MM  20MM (Screw) ×1 IMPLANT
SCREW CORTICAL 3.5MM 16MM (Screw) IMPLANT
SCREW CORTICAL 3.5MM 20MM (Screw) IMPLANT
SCREW CORTICAL 3.5MM 24MM (Screw) ×1 IMPLANT
SCREW LOCK 3.5X16 DIST TIB (Screw) ×1 IMPLANT
SCREW LOCK 3.5X18 DIST TIB (Screw) ×1 IMPLANT
SCREW LOCK CORT STAR 3.5X12 (Screw) ×1 IMPLANT
SCREW LOCK CORT STAR 3.5X14 (Screw) ×2 IMPLANT
SPLINT PLASTER CAST XFAST 5X30 (CAST SUPPLIES) IMPLANT
SPLINT PLASTER XFAST SET 5X30 (CAST SUPPLIES) ×2
SPONGE T-LAP 18X18 ~~LOC~~+RFID (SPONGE) ×2 IMPLANT
SUCTION FRAZIER HANDLE 10FR (MISCELLANEOUS) ×1
SUCTION TUBE FRAZIER 10FR DISP (MISCELLANEOUS) ×1 IMPLANT
SUT ETHILON 3 0 PS 1 (SUTURE) ×3 IMPLANT
SUT VIC AB 2-0 SH 27 (SUTURE) ×2
SUT VIC AB 2-0 SH 27XBRD (SUTURE) ×1 IMPLANT
SUT VIC AB 3-0 SH 27 (SUTURE) ×1
SUT VIC AB 3-0 SH 27X BRD (SUTURE) IMPLANT
WATER STERILE IRR 1000ML POUR (IV SOLUTION) ×2 IMPLANT
ZIPTIGHT ANKLE SYNODESMOSS FIX (Ankle) ×2 IMPLANT

## 2021-10-12 NOTE — Plan of Care (Signed)
  Problem: Education: Goal: Knowledge of General Education information will improve Description: Including pain rating scale, medication(s)/side effects and non-pharmacologic comfort measures Outcome: Progressing   Problem: Coping: Goal: Level of anxiety will decrease Outcome: Progressing   Problem: Pain Managment: Goal: General experience of comfort will improve Outcome: Progressing   

## 2021-10-12 NOTE — Anesthesia Procedure Notes (Signed)
Anesthesia Regional Block: Adductor canal block   Pre-Anesthetic Checklist: , timeout performed,  Correct Patient, Correct Site, Correct Laterality,  Correct Procedure, Correct Position, site marked,  Risks and benefits discussed,  Surgical consent,  Pre-op evaluation,  At surgeon's request and post-op pain management  Laterality: Lower and Right  Prep: chloraprep       Needles:  Injection technique: Single-shot  Needle Type: Stimiplex     Needle Length: 9cm  Needle Gauge: 21     Additional Needles:   Procedures:,,,, ultrasound used (permanent image in chart),,    Narrative:  Start time: 10/12/2021 12:17 PM End time: 10/12/2021 12:32 PM Injection made incrementally with aspirations every 5 mL.  Performed by: Personally  Anesthesiologist: Nolon Nations, MD  Additional Notes: BP cuff, EKG monitors applied. Sedation begun. Artery and nerve location verified with ultrasound. Anesthetic injected incrementally (33ml), slowly, and after negative aspirations under direct u/s guidance. Good fascial/perineural spread. Tolerated well.

## 2021-10-12 NOTE — Transfer of Care (Signed)
Immediate Anesthesia Transfer of Care Note  Patient: Nicole Hunt  Procedure(s) Performed: OPEN REDUCTION INTERNAL FIXATION (ORIF) ANKLE FRACTURE WITH SYNDESMOSIS REPAIR (Right: Ankle)  Patient Location: PACU  Anesthesia Type:GA combined with regional for post-op pain  Level of Consciousness: awake, alert  and oriented  Airway & Oxygen Therapy: Patient Spontanous Breathing  Post-op Assessment: Report given to RN and Post -op Vital signs reviewed and stable  Post vital signs: Reviewed and stable  Last Vitals:  Vitals Value Taken Time  BP    Temp    Pulse 83 10/12/21 1606  Resp 16 10/12/21 1606  SpO2 91 % 10/12/21 1606  Vitals shown include unvalidated device data.  Last Pain:  Vitals:   10/12/21 1140  TempSrc: Oral  PainSc: 4          Complications: No notable events documented.

## 2021-10-12 NOTE — Op Note (Addendum)
10/12/2021  7:13 PM   PATIENT: Nicole Hunt  61 y.o. female  MRN: [id number]   PRE-OPERATIVE DIAGNOSIS:   Right trimalleolar ankle fracture   POST-OPERATIVE DIAGNOSIS:   Right trimalleolar ankle fracture   PROCEDURE: Open reduction internal fixation of right trimalleolar ankle fracture without fixation of posterior lip Open fixation of right ankle syndesmosis   SURGEON:  Armond Hang, MD   ASSISTANT: None   ANESTHESIA: General, regional   EBL: Minimal   TOURNIQUET:    Total Tourniquet Time Documented: Thigh (Right) - 93 minutes Total: Thigh (Right) - 93 minutes    COMPLICATIONS: None apparent   DISPOSITION: Extubated, awake and stable to recovery.   INDICATION FOR PROCEDURE: 61 year old female presented to ED on 10/11/21 with right ankle injury.  She was walking her dog down a slope and lost her balance. She has hypertension - otherwise healthy. 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  We discussed the diagnosis, alternative treatment options, risks and benefits of the above surgical intervention, as well as alternative non-operative treatments. All questions/concerns were addressed and the patient/family demonstrated appropriate understanding of the diagnosis, the procedure, the postoperative course, and overall prognosis. The patient wished to proceed with surgical intervention and signed an informed surgical consent as such, in each others presence prior to surgery.   PROCEDURE IN DETAIL: After preoperative consent was obtained and the correct operative site was identified, the patient was brought to the operating room supine on stretcher and transferred onto operating table. General anesthesia was induced. Preoperative antibiotics were administered. Surgical timeout was taken. The patient was then positioned supine with an ipsilateral hip bump. The operative lower extremity was prepped and draped in standard sterile fashion with a  tourniquet around the thigh. The extremity was exsanguinated and the tourniquet was inflated to 300 mmHg.  Inspection of the skin showed narrow regions of longitudinal serous blistering along medial and lateral aspects of the distal right leg corresponding to the sites of splint placement by ED. There was minimal swelling with skin wrinkling present throughout. All incisions were made avoiding the blisters.   A standard lateral incision was made over the distal fibula. Dissection was carried down to the level of the fibula and the fracture site identified. The superficial peroneal nerve was identified and protected throughout the procedure. The fibula was noted to be shortened with interposed periosteum. The fibula was brought out to length. The fibula fracture was debrided and the edges defined to achieve cortical read. Reduction maneuver was performed using pointed reduction forceps and lobster forceps. In this manner, the fibula length was restored and fracture reduced. A lag screw was placed according to the orientation of fracture lines and comminution. Due to poor bone quality and extensive comminution at the fracture site, it was decided to use an anatomic locking distal fibula plate. We then selected a Zimmer composite locking plate to match the anatomy of the distal fibula and placed it laterally. This was implanted under intraoperative fluoroscopy with a combination of distal locking screws and proximal cortical & locking screws.  We then turned to the medial malleolar fracture. After obtaining reduction under fluoroscopy, a Kirschner wire was placed to secure this reduction and to serve as guide for a cannulated screw. We then made an incision around the wire and overdrilled this with a cannulated drill. We then placed a 4.0 mm Zimmer Biomet partially threaded lag screw. This screw was noted to achieve excellent compression across the fracture site and also have excellent  purchase. We verified  position of the screw and fracture reduction in all planes with fluoroscopy.  A manual external rotation stress radiograph was obtained and demonstrated slight widening of the ankle mortise. Given this intraoperative finding as well as fracture pattern, it was decided to reduce and fix the syndesmosis. Therefore a suture fixation system (ZipTight device) was implanted through the fibula plate in cannulated fashion to fix the syndesmosis. Anchor/button position was verified along anteromedial tibial cortex by both fluoroscopy and direct visualization through the medial incision. A repeat stress radiograph showed complete stability of the ankle mortise to testing.  The surgical sites were thoroughly irrigated. The tourniquet was deflated and hemostasis achieved. Vancomycin powder was applied. The deep layers were closed using 2-0 vicryl and the subcutaneous tissues closed using 3-0 vicryl. The skin was closed without tension using 3-0 nylon suture.    The leg was cleaned with saline and sterile xeroform dressings with gauze were applied. A well padded bulky short leg splint was applied. The patient was awakened from anesthesia and transported to the recovery room in stable condition.    FOLLOW UP PLAN: -transfer to PACU, then RNF followed by discharge home tonight -strict NWB operative extremity, maximum elevation -maintain short leg splint until follow up -DVT Ppx: Aspirin 325 mg twice daily x 30 days -follow up in 7-10 days for wound check -sutures out in 2-3 weeks with exchange of short leg splint to short leg cast in outpatient office   RADIOGRAPHS: AP, lateral, oblique and stress radiographs of the right ankle were obtained intraoperatively. These showed interval reduction and fixation of the fractures. Manual stress radiographs were taken and the ankle mortise and tibiofibular relationship were noted to be stable following fixation. All hardware is appropriately positioned and of the  appropriate lengths. No other acute injuries are noted.   Armond Hang Orthopaedic Surgery [facility]

## 2021-10-12 NOTE — Anesthesia Procedure Notes (Signed)
Anesthesia Regional Block: Popliteal block   Pre-Anesthetic Checklist: , timeout performed,  Correct Patient, Correct Site, Correct Laterality,  Correct Procedure, Correct Position, site marked,  Risks and benefits discussed,  Surgical consent,  Pre-op evaluation,  At surgeon's request and post-op pain management  Laterality: Lower and Right  Prep: chloraprep       Needles:  Injection technique: Single-shot  Needle Type: Stimiplex     Needle Length: 10cm  Needle Gauge: 21     Additional Needles:   Procedures:,,,, ultrasound used (permanent image in chart),,   Motor weakness within 5 minutes.  Narrative:  Start time: 10/12/2021 12:32 PM End time: 10/12/2021 12:37 PM Injection made incrementally with aspirations every 5 mL.  Performed by: Personally  Anesthesiologist: Nolon Nations, MD  Additional Notes: Nerve located and needle positioned with direct ultrasound guidance. Good perineural spread. Patient tolerated well.

## 2021-10-12 NOTE — H&P (Signed)
H&P Update: ? ?-History and Physical Reviewed ? ?-Patient has been re-examined ? ?-No change in the plan of care ? ?-The risks and benefits were presented and reviewed. The risks due to hardware failure/irritation, new/persistent infection, stiffness, nerve/vessel/tendon injury, nonunion/malunion, wound healing issues, development of arthritis, failure of this surgery, possibility of external fixation with delayed definitive surgery, need for further surgery, thromboembolic events, anesthesia/medical complications, amputation, death among others were discussed. The patient acknowledged the explanation, agreed to proceed with the plan and a consent was signed. ? ?Nicole Hunt ? ?

## 2021-10-12 NOTE — Progress Notes (Signed)
Report given to Short stay RN. Patient transported to Preop.

## 2021-10-12 NOTE — Anesthesia Preprocedure Evaluation (Signed)
Anesthesia Evaluation  Patient identified by MRN, date of birth, ID band Patient awake    Reviewed: Allergy & Precautions, NPO status , Patient's Chart, lab work & pertinent test results  Airway Mallampati: II  TM Distance: >3 FB Neck ROM: Full    Dental no notable dental hx. (+) Dental Advisory Given, Teeth Intact   Pulmonary neg pulmonary ROS,    Pulmonary exam normal breath sounds clear to auscultation       Cardiovascular hypertension, Pt. on medications Normal cardiovascular exam+ Valvular Problems/Murmurs  Rhythm:Regular Rate:Normal     Neuro/Psych negative neurological ROS     GI/Hepatic negative GI ROS, Neg liver ROS,   Endo/Other  negative endocrine ROS  Renal/GU negative Renal ROS     Musculoskeletal  (+) Arthritis ,   Abdominal   Peds  Hematology negative hematology ROS (+)   Anesthesia Other Findings   Reproductive/Obstetrics                            Anesthesia Physical Anesthesia Plan  ASA: 2  Anesthesia Plan: General   Post-op Pain Management: Minimal or no pain anticipated, Tylenol PO (pre-op) and Celebrex PO (pre-op)   Induction: Intravenous  PONV Risk Score and Plan: 3 and Ondansetron, Dexamethasone, Midazolam, Scopolamine patch - Pre-op and Treatment may vary due to age or medical condition  Airway Management Planned: LMA  Additional Equipment: None  Intra-op Plan:   Post-operative Plan: Extubation in OR  Informed Consent: I have reviewed the patients History and Physical, chart, labs and discussed the procedure including the risks, benefits and alternatives for the proposed anesthesia with the patient or authorized representative who has indicated his/her understanding and acceptance.     Dental advisory given  Plan Discussed with: CRNA  Anesthesia Plan Comments:        Anesthesia Quick Evaluation

## 2021-10-12 NOTE — Brief Op Note (Signed)
10/12/2021  4:35 PM  PATIENT:  Nicole Hunt  61 y.o. female  PRE-OPERATIVE DIAGNOSIS:  RIGHT ANKLE FRACTURE  POST-OPERATIVE DIAGNOSIS:  RIGHT ANKLE FRACTURE  PROCEDURE:  Procedure(s): OPEN REDUCTION INTERNAL FIXATION (ORIF) ANKLE FRACTURE WITH SYNDESMOSIS REPAIR (Right)  SURGEON:  Surgeon(s) and Role:    * Armond Hang, MD - Primary  PHYSICIAN ASSISTANT: None  ASSISTANTS: none   ANESTHESIA:   regional and general  EBL:  Minimal  BLOOD ADMINISTERED:none  DRAINS: none   LOCAL MEDICATIONS USED:  OTHER Vanc powder  SPECIMEN:  No Specimen  DISPOSITION OF SPECIMEN:  N/A  COUNTS:  YES  TOURNIQUET:   Total Tourniquet Time Documented: Thigh (Right) - 93 minutes Total: Thigh (Right) - 93 minutes   DICTATION: .Note written in EPIC  PLAN OF CARE: Discharge to home after PACU  PATIENT DISPOSITION:  PACU - hemodynamically stable.   Delay start of Pharmacological VTE agent (>24hrs) due to surgical blood loss or risk of bleeding: yes

## 2021-10-12 NOTE — Progress Notes (Signed)
Patient Arrived from PACU surgeon at bedside, stated patient is allowed to go home this evening. VSS. OOB ambulating with crutches. Voided. Nerve block still in affect. 0/10 pain. Drsg c/d/I. Reviewed discharge paperwork with patient and gave printed scripts to her. Call bell within reach. Patient is waiting for sister to arrived to bring clothes to be discharged.

## 2021-10-12 NOTE — Evaluation (Signed)
Physical Therapy Evaluation Patient Details Name: Nicole Hunt MRN: [id number] DOB: [birth year] Today's Date: 10/12/2021  History of Present Illness  Pt is a 61 y.o. F who presents 10/11/2021 right right trimalleolar ankle fracture dislocation s/p closed reduction. Plan for ORIF 10/12/2020. Significant PMH: HTN.  Clinical Impression  Pt admitted with above. Pt presents with good pain control; she is pleasant and agreeable to physical therapy evaluation. PTA, pt lives with her mother in a home with 3 STE and is retired Nature conservation officer. Pt reporting good pain control and is able to wiggle toes. Trialed crutches vs walker for transfer and gait training. Pt with improved stability and requiring less assist with use of walker, hopping room distances with good adherence to weightbearing precautions. Will reassess post op.     Recommendations for follow up therapy are one component of a multi-disciplinary discharge planning process, led by the attending physician.  Recommendations may be updated based on patient status, additional functional criteria and insurance authorization.  Follow Up Recommendations No PT follow up (will benefit from OPPT once weightbearing status changes)    Assistance Recommended at Discharge PRN  Patient can return home with the following  A little help with walking and/or transfers;Help with stairs or ramp for entrance    Equipment Recommendations None recommended by PT (pt has needed DME)  Recommendations for Other Services       Functional Status Assessment Patient has had a recent decline in their functional status and demonstrates the ability to make significant improvements in function in a reasonable and predictable amount of time.     Precautions / Restrictions Precautions Precautions: Fall Restrictions Weight Bearing Restrictions: No      Mobility  Bed Mobility Overal bed mobility: Modified Independent                  Transfers Overall transfer  level: Needs assistance Equipment used: Rolling walker (2 wheels), Crutches Transfers: Sit to/from Stand Sit to Stand: Min guard, Min assist           General transfer comment: MinA to initially rise and steady with crutch use, cues for technique. Min guard to rise from recliner to walker, good power up    Ambulation/Gait Ambulation/Gait assistance: Min guard, Min assist Gait Distance (Feet): 40 Feet (10 ft, then 30 ft) Assistive device: Rolling walker (2 wheels), Crutches Gait Pattern/deviations: Step-to pattern Gait velocity: decreased Gait velocity interpretation: <1.8 ft/sec, indicate of risk for recurrent falls   General Gait Details: Pt initially trialing crutches first ~10 ft, minA for balance due to instability, cues for sequencing/technique. Pt then hopping ~30 ft with walker, improved balance, good adherence to weightbearing precautions  Stairs            Wheelchair Mobility    Modified Rankin (Stroke Patients Only)       Balance Overall balance assessment: Needs assistance Sitting-balance support: Feet supported Sitting balance-Leahy Scale: Good     Standing balance support: No upper extremity supported, During functional activity Standing balance-Leahy Scale: Fair                               Pertinent Vitals/Pain Pain Assessment Pain Assessment: Faces Faces Pain Scale: Hurts a little bit Pain Location: R ankle Pain Descriptors / Indicators: Guarding Pain Intervention(s): Monitored during session    Home Living Family/patient expects to be discharged to:: Private residence Living Arrangements: Parent Available Help at Discharge: Family Type of Home: House  Home Access: Stairs to enter Entrance Stairs-Rails: Chemical engineer of Steps: 3   Home Layout: One level Home Equipment: BSC/3in1;Shower seat;Rolling Environmental consultant (2 wheels);Cane - quad      Prior Function Prior Level of Function : Independent/Modified  Independent;Other (comment) (retired)             Mobility Comments: no AD ADLs 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     Hand Dominance        Extremity/Trunk Assessment   Upper Extremity Assessment Upper Extremity Assessment: Defer to OT evaluation    Lower Extremity Assessment Lower Extremity Assessment: RLE deficits/detail;LLE deficits/detail RLE Deficits / Details: s/p trimalleolar fx. Able to wiggle toes, hip/knee WFL LLE Deficits / Details: Strength 5/5    Cervical / Trunk Assessment Cervical / Trunk Assessment: Normal  Communication   Communication: No difficulties  Cognition Arousal/Alertness: Awake/alert Behavior During Therapy: WFL for tasks assessed/performed Overall Cognitive Status: Within Functional Limits for tasks assessed                                 General Comments: very pleasant        General Comments      Exercises     Assessment/Plan    PT Assessment Patient needs continued PT services  PT Problem List Decreased strength;Decreased activity tolerance;Decreased balance;Decreased mobility;Pain       PT Treatment Interventions DME instruction;Gait training;Stair training;Functional mobility training;Therapeutic activities;Therapeutic exercise;Balance training;Patient/family education    PT Goals (Current goals can be found in the Care Plan section)  Acute Rehab PT Goals Patient Stated Goal: return to baseline PT Goal Formulation: With patient Time For Goal Achievement: 10/26/21 Potential to Achieve Goals: Good    Frequency Min 5X/week     Co-evaluation               AM-PAC PT "6 Clicks" Mobility  Outcome Measure Help needed turning from your back to your side while in a flat bed without using bedrails?: None Help needed moving from lying on your back to sitting on the side of a flat bed without using bedrails?: None Help needed moving to and from a bed to a chair (including a wheelchair)?: A Little Help needed standing  up from a chair using your arms (e.g., wheelchair or bedside chair)?: A Little Help needed to walk in hospital room?: A Little Help needed climbing 3-5 steps with a railing? : A Little 6 Click Score: 20    End of Session Equipment Utilized During Treatment: Gait belt Activity Tolerance: Patient tolerated treatment well Patient left: in chair;with call bell/phone within reach;with chair alarm set Nurse Communication: Mobility status PT Visit Diagnosis: Unsteadiness on feet (R26.81);Difficulty in walking, not elsewhere classified (R26.2);Pain Pain - Right/Left: Right Pain - part of body: Ankle and joints of foot    Time: 0912-0922 PT Time Calculation (min) (ACUTE ONLY): 10 min   Charges:   PT Evaluation $PT Eval Low Complexity: [place], PT, DPT Acute Rehabilitation Services Pager [phone] Office [phone]   Deno Etienne 10/12/2021, 10:13 AM

## 2021-10-12 NOTE — H&P (Signed)
Patient ID: Nicole Hunt MRN: [id number] DOB/AGE: 59-Mar-1962 59 y.o.  Admit date: 10/11/2021  Admission Diagnoses:  Principal Problem:   Trimalleolar fracture of ankle, closed, right, initial encounter Active Problems:   Fracture   HPI: 61 year old female presented to ED on 10/11/21 with right ankle injury.  She was walking her dog down a slope and lost her balance. She has hypertension - otherwise healthy. 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 Ortho was consulted after imaging showed trimalleolar ankle fracture of the right leg.  Past Medical History: Past Medical History:  Diagnosis Date   Arthritis    Chronic kidney disease    3A   Heart murmur    pt. states beign   Hypertension    Sleep apnea     Surgical History: Past Surgical History:  Procedure Laterality Date   PARTIAL HYSTERECTOMY      Family History: Family History  Problem Relation Age of Onset   Cancer Mother    Hypothyroidism Mother    Arthritis Mother    Stroke Father    Hypertension Father    Hypertension Sister    Hypertension Brother    Healthy Brother    Congestive Heart Failure Maternal Grandmother     Social History: Social History   Socioeconomic History   Marital status: Divorced    Spouse name: Not on file   Number of children: Not on file   Years of education: Not on file   Highest education level: Not on file  Occupational History   Not on file  Tobacco Use   Smoking status: Never   Smokeless tobacco: Never  Vaping Use   Vaping Use: Never used  Substance and Sexual Activity   Alcohol use: Never   Drug use: Never   Sexual activity: Not on file  Other Topics Concern   Not on file  Social History Narrative   Not on file   Social Determinants of Health   Financial Resource Strain: Not on file  Food Insecurity: Not on file  Transportation Needs: Not on file  Physical Activity: Not on file  Stress: Not on file  Social Connections: Not on file   Intimate Partner Violence: Not on file    Allergies: Patient has no known allergies.  Medications: I have reviewed the patient's current medications.  Vital Signs: Patient Vitals for the past 24 hrs:  BP Temp Temp src Pulse Resp SpO2  10/12/21 1659 136/75 97.6 F (36.4 C) Oral 81 18 97 %  10/12/21 1637 133/81 97.7 F (36.5 C) -- 83 17 96 %  10/12/21 1625 136/79 -- -- 86 16 93 %  10/12/21 1605 129/78 97.6 F (36.4 C) -- 85 16 91 %  10/12/21 1250 (!) 121/54 -- -- 61 16 98 %  10/12/21 1245 (!) 145/64 -- -- (!) 59 20 97 %  10/12/21 1225 (!) 143/80 -- -- 67 -- --  10/12/21 1140 (!) 167/80 98.3 F (36.8 C) Oral 73 16 99 %  10/12/21 0900 (!) 136/49 98.3 F (36.8 C) Oral 77 17 97 %  10/12/21 0403 (!) 153/81 98.6 F (37 C) Oral 80 -- 96 %  10/12/21 0036 (!) 141/68 99.2 F (37.3 C) Oral 84 -- 98 %  10/11/21 2100 (!) 150/85 -- -- 83 (!) 21 100 %    Radiology: DG Ankle 2 Views Right  Result Date: 10/12/2021 CLINICAL DATA:  Right ankle ORIF EXAM: RIGHT ANKLE - 2 VIEW; DG C-ARM 1-60 MIN-NO REPORT COMPARISON:  10/11/2021 FINDINGS: Three C-arm fluoroscopic images were obtained intraoperatively and submitted for post operative interpretation. Images obtained during ORIF of trimalleolar right ankle fracture. 2.3 minutes of fluoroscopy time utilized. Please see the performing provider's procedural report for further detail. IMPRESSION: As above. Electronically Signed   By: Davina Poke D.O.   On: 10/12/2021 15:32   MR ABDOMEN WWO CONTRAST  Result Date: 10/10/2021 CLINICAL DATA:  Follow-up left renal lesion. EXAM: MRI ABDOMEN WITHOUT AND WITH CONTRAST TECHNIQUE: Multiplanar multisequence MR imaging of the abdomen was performed both before and after the administration of intravenous contrast. CONTRAST:  33mL MULTIHANCE GADOBENATE DIMEGLUMINE 529 MG/ML IV SOLN COMPARISON:  MRI October 18, 2020 and ultrasound May 10, 2020 FINDINGS: Lower chest: No acute abnormality. Hepatobiliary: No  significant hepatic steatosis. No suspicious hepatic lesion. Gallbladder is unremarkable. No biliary ductal dilation. Pancreas: Intrinsic T1 signal of the pancreatic parenchyma is within normal limits. Homogeneous pancreatic parenchymal enhancement postcontrast administration. No pancreatic ductal dilation. No cystic or solid hyperenhancing pancreatic lesion identified. Spleen:  Within normal limits in size and appearance. Adrenals/Urinary Tract:  Bilateral adrenal glands appear normal. The right kidney is unremarkable without hydronephrosis or mass. Similar left renal atrophy without hydronephrosis. 2 small fluid density cysts are again seen in the upper pole left kidney measuring up to 9 mm. The small interpolar renal lesion seen on prior study which is T2 hypointense and slightly T1 hyperintense is decreased in size not demonstrating crenulated appearance on measuring approximately 3 mm on image 22/4. While this lesion is too small to accurately characterize there is no visible postcontrast enhancement for instance on image 89/21, and almost certainly reflects an involuting hemorrhagic/proteinaceous cyst. No solid enhancing renal lesions identified. Stomach/Bowel: Fluid density lesion with thin enhancing septations arising along the lateral aspect of the stomach and within the splenic hilum which measures 5.0 x 2.4 x 3.5 cm on images 9/4 and 15/3, which in retrospect was present on prior imaging measuring 3.1 x 2.2 x 2.7 cm. Formed stool throughout the visualized colon. No pathologic dilation or evidence of acute inflammation involving loops of large or small bowel in the abdomen. Vascular/Lymphatic: No pathologically enlarged abdominal lymph nodes. No abdominal aortic aneurysm. Other:  No abdominal ascites. Musculoskeletal: No suspicious bone lesions identified. IMPRESSION: 1. The small interpolar renal lesion seen on prior study is decreased in size now measuring approximately 3 mm and while this is too small  to accurately characterize there is no visible postcontrast enhancement on either today's MRI or MRI October 19, 1939 and almost certainly reflects a benign involuting hemorrhagic/proteinaceous cyst. Given the lesions small size and inability to definitively characterize this lesion consider a 1 year follow-up renal protocol MRI with and without contrast to assess stability. 2. Similar left renal atrophy without hydronephrosis. 3. Fluid density 5 cm lesion with thin walls and thin internal septations and no suspicious enhancement arising along the lateral aspect of the stomach and within the splenic hilum, which in retrospect was present on prior imaging and has slightly increased in size, this is nonspecific with a primary differential consideration of a lymphocele with secondary differential considerations of a pseudocyst or duplication cyst. Consider attention on follow-up imaging versus further evaluation with EUS and fluid sampling if clinically indicated. Electronically Signed   By: Dahlia Bailiff M.D.   On: 10/10/2021 11:00   CT Ankle Right Wo Contrast  Result Date: 10/11/2021 CLINICAL DATA:  Known fracture dislocation, status post reduction EXAM: CT OF THE RIGHT ANKLE WITHOUT CONTRAST TECHNIQUE: Multidetector  CT imaging of the right ankle was performed according to the standard protocol. Multiplanar CT image reconstructions were also generated. RADIATION DOSE REDUCTION: This exam was performed according to the departmental dose-optimization program which includes automated exposure control, adjustment of the mA and/or kV according to patient size and/or use of iterative reconstruction technique. COMPARISON:  Films from earlier in the same day. FINDINGS: Bones/Joint/Cartilage Oblique fracture through the distal fibular metaphysis is noted similar to that seen on recent plain film examination. Mild comminution is seen. The medial malleolar fracture is again identified with mild lateral displacement of the  distal fracture fragment with respect to the distal tibia. Small avulsion from the posterior malleolus is noted. The talus is well seated but remains mildly laterally displaced with respect to the distal tibia. No tarsal fractures are seen. Calcaneus appears within normal limits. Ligaments Suboptimally assessed by CT. Muscles and Tendons Musculature appears within normal limits. Soft tissues Soft tissue swelling is noted without focal hematoma. IMPRESSION: Trimalleolar fracture with only minimal displacement of the fracture fragments and persistent mild lateral displacement of the talus with respect to the distal tibia. Electronically Signed   By: Inez Catalina M.D.   On: 10/11/2021 20:47   DG Ankle Right Port  Result Date: 10/11/2021 CLINICAL DATA:  Status post reduction of ankle fracture. EXAM: PORTABLE RIGHT ANKLE - 2 VIEW COMPARISON:  Same day. FINDINGS: The right ankle has been splinted and immobilized. There is significantly improved alignment of the distal right fibular and medial malleolar fractures compared to prior exam. Talotibial dislocation has been reduced. IMPRESSION: Reduction of talotibial dislocation. Significantly improved alignment of distal right fibular and medial malleolar fractures. Electronically Signed   By: Marijo Conception M.D.   On: 10/11/2021 19:11   DG Ankle Right Port  Result Date: 10/11/2021 CLINICAL DATA:  Fall. EXAM: PORTABLE RIGHT ANKLE - 2 VIEW COMPARISON:  None. FINDINGS: There is an acute oblique fracture through the lateral malleolus at the level of the ankle mortise. There is apex anteromedial angulation with 1 shaft with anteromedial displacement of the proximal fracture fragment. There is an acute transverse fracture through the medial malleolus. There is 1 shaft with medial and anterior dislocation of the distal tibia in relation to the talar dome. IMPRESSION: 1. Markedly displaced fractures of the distal fibula and medial malleolus. 2. Anteromedial dislocation of  the tibia in relation to the talar dome. Electronically Signed   By: Ronney Asters M.D.   On: 10/11/2021 18:34   DG C-Arm 1-60 Min-No Report  Result Date: 10/12/2021 Fluoroscopy was utilized by the requesting physician.  No radiographic interpretation.    Labs: Recent Labs    10/11/21 2130  WBC 7.3  RBC 4.12  HCT 37.4  PLT 255   Recent Labs    10/11/21 2130  NA 135  K 4.1  CL 107  CO2 23  BUN 13  CREATININE 1.08*  GLUCOSE 103*  CALCIUM 9.7   No results for input(s): LABPT, INR in the last 72 hours.  Review of Systems: ROS as detailed in HPI  Physical Exam: Body mass index is 29.53 kg/m.  Gen: AAOx3, NAD  Right lower extremity: Short leg splint in place (not removed to maintain reduction) Wiggles toes SILT over toes DP 2+ (PT not accessible for palpation in splint) CR<2s   Assessment and Plan: 61 year old otherwise healthy female presented to ED on 10/11/21 with right trimalleolar ankle fracture  -pt admitted under myself as transfer from [facility] to [facility] -plan  for OR on 10/12/21 for right ankle ORIF, syndesmosis fixation with possible external fixation with delayed definitive fixation -CT reviewed with patient and for preoperative planning -NPO since midnight 10/12/21 at 0000h -held VTE ppx for 24 hours prior to surgery -plan for patient to discharge home with sister POD0  The risks and benefits were presented and reviewed. The risks due to hardware failure/irritation, new/persistent infection, stiffness, nerve/vessel/tendon injury, nonunion/malunion, wound healing issues, development of arthritis, failure of this surgery, possibility of external fixation with delayed definitive surgery, need for further surgery, thromboembolic events, anesthesia/medical complications, amputation, death among others were discussed. The patient acknowledged the explanation and agreed to proceed with the plan.   Armond Hang, MD Orthopaedic  Surgeon [facility] [phone]

## 2021-10-12 NOTE — Anesthesia Procedure Notes (Addendum)
Procedure Name: LMA Insertion Date/Time: 10/12/2021 1:41 PM Performed by: Wilburn Cornelia, CRNA Pre-anesthesia Checklist: Patient identified, Emergency Drugs available, Suction available, Patient being monitored and Timeout performed Patient Re-evaluated:Patient Re-evaluated prior to induction Oxygen Delivery Method: Circle system utilized Preoxygenation: Pre-oxygenation with 100% oxygen Induction Type: IV induction Ventilation: Mask ventilation without difficulty LMA: LMA inserted LMA Size: 4.0 Number of attempts: 1 Placement Confirmation: positive ETCO2 and breath sounds checked- equal and bilateral Tube secured with: Tape Dental Injury: Teeth and Oropharynx as per pre-operative assessment

## 2021-10-12 NOTE — Anesthesia Postprocedure Evaluation (Signed)
Anesthesia Post Note  Patient: Nicole Hunt  Procedure(s) Performed: OPEN REDUCTION INTERNAL FIXATION (ORIF) ANKLE FRACTURE WITH SYNDESMOSIS REPAIR (Right: Ankle)     Patient location during evaluation: PACU Anesthesia Type: General Level of consciousness: sedated and patient cooperative Pain management: pain level controlled Vital Signs Assessment: post-procedure vital signs reviewed and stable Respiratory status: spontaneous breathing Cardiovascular status: stable Anesthetic complications: no   No notable events documented.  Last Vitals:  Vitals:   10/12/21 1637 10/12/21 1659  BP: 133/81 136/75  Pulse: 83 81  Resp: 17 18  Temp: 36.5 C 36.4 C  SpO2: 96% 97%    Last Pain:  Vitals:   10/12/21 1659  TempSrc: Oral  PainSc: 0-No pain                 Nolon Nations

## 2021-10-12 NOTE — Evaluation (Signed)
Occupational Therapy Evaluation Patient Details Name: Nicole Hunt MRN: [id number] DOB: Jan 29, 1961 Today's Date: 10/12/2021   History of Present Illness Pt is a 61 y.o. F who presents 10/11/2021 right right trimalleolar ankle fracture dislocation s/p closed reduction. Plan for ORIF 10/12/2020. Significant PMH: HTN.   Clinical Impression   Pt reports living with her mom, is independent at baseline with ADLs and functional mobility. Currently independent  - min A for ADLs, mod I for bed mobility, and min guard/min A for transfers. Educated pt on WB precautions, adheres well throughout session. Pt presenting with impairments listed below, will follow acutely. Would benefit from 1-2 more sessions acutely after surgery, anticipate safe d/c home.   Recommendations for follow up therapy are one component of a multi-disciplinary discharge planning process, led by the attending physician.  Recommendations may be updated based on patient status, additional functional criteria and insurance authorization.   Follow Up Recommendations  No OT follow up    Assistance Recommended at Discharge Set up Supervision/Assistance  Patient can return home with the following A little help with walking and/or transfers;A little help with bathing/dressing/bathroom;Assistance with cooking/housework;Help with stairs or ramp for entrance;Assist for transportation    Functional Status Assessment  Patient has had a recent decline in their functional status and demonstrates the ability to make significant improvements in function in a reasonable and predictable amount of time.  Equipment Recommendations  None recommended by OT;Other (comment) (pt has all needed DME)    Recommendations for Other Services PT consult     Precautions / Restrictions Precautions Precautions: Fall Restrictions Weight Bearing Restrictions: Yes RLE Weight Bearing: Non weight bearing      Mobility Bed Mobility Overal bed mobility:  Modified Independent                  Transfers Overall transfer level: Needs assistance Equipment used: Rolling walker (2 wheels), Crutches Transfers: Sit to/from Stand Sit to Stand: Min guard, Min assist                  Balance Overall balance assessment: Needs assistance Sitting-balance support: Feet supported Sitting balance-Leahy Scale: Good     Standing balance support: No upper extremity supported, During functional activity Standing balance-Leahy Scale: Fair Standing balance comment: able to stand statically for LB dressing task                           ADL either performed or assessed with clinical judgement   ADL Overall ADL's : Needs assistance/impaired Eating/Feeding: Independent;Sitting   Grooming: Independent;Sitting   Upper Body Bathing: Set up;Sitting   Lower Body Bathing: Sitting/lateral leans;Minimal assistance   Upper Body Dressing : Supervision/safety;Sitting   Lower Body Dressing: Minimal assistance;Sitting/lateral leans   Toilet Transfer: Min guard;Minimal assistance;Rolling walker (2 wheels);Ambulation;Regular Glass blower/designer Details (indicate cue type and reason): simualted in room Toileting- Clothing Manipulation and Hygiene: Maximal assistance;Sit to/from stand Toileting - Clothing Manipulation Details (indicate cue type and reason): pt required RW support for balance in standing     Functional mobility during ADLs: Min guard;Minimal assistance       Vision   Vision Assessment?: No apparent visual deficits     Perception     Praxis      Pertinent Vitals/Pain Pain Assessment Pain Assessment: Faces Pain Score: 2  Faces Pain Scale: Hurts a little bit Pain Location: R ankle Pain Descriptors / Indicators: Guarding Pain Intervention(s): Limited activity within patient's tolerance, Monitored during  session, Repositioned     Hand Dominance     Extremity/Trunk Assessment Upper Extremity  Assessment Upper Extremity Assessment: Overall WFL for tasks assessed   Lower Extremity Assessment Lower Extremity Assessment: Defer to PT evaluation RLE Deficits / Details: s/p trimalleolar fx. Able to wiggle toes, hip/knee WFL LLE Deficits / Details: Strength 5/5   Cervical / Trunk Assessment Cervical / Trunk Assessment: Normal   Communication Communication Communication: No difficulties   Cognition Arousal/Alertness: Awake/alert Behavior During Therapy: WFL for tasks assessed/performed Overall Cognitive Status: Within Functional Limits for tasks assessed                                 General Comments: very pleasant  and receptive to informatin/precautions     General Comments  VSS throughout session, pt verbalized understanding and adheres to WB precautions well throughout session    Exercises     Shoulder Instructions      Home Living Family/patient expects to be discharged to:: Private residence Living Arrangements: Parent Available Help at Discharge: Family Type of Home: House Home Access: Stairs to enter Technical brewer of Steps: 3 Entrance Stairs-Rails: Left;Right Home Layout: One level     Bathroom Shower/Tub: Tub/shower unit;Curtain   Bathroom Toilet: Handicapped height Bathroom Accessibility: Yes How Accessible: Accessible via walker Home Equipment: BSC/3in1;Shower seat;Rolling Environmental consultant (2 wheels);Cane - quad          Prior Functioning/Environment Prior Level of Function : Independent/Modified Independent;Other (comment)             Mobility Comments: no AD ADLs 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, retired Associate Professor Problem List: Decreased activity tolerance;Impaired balance (sitting and/or standing);Decreased range of motion;Decreased knowledge of use of DME or AE      OT Treatment/Interventions: Self-care/ADL training;Therapeutic exercise;Neuromuscular education    OT Goals(Current goals can be found in the care plan  section) Acute Rehab OT Goals Patient Stated Goal: none stated OT Goal Formulation: With patient Time For Goal Achievement: 10/26/21 Potential to Achieve Goals: Good ADL Goals Pt Will Perform Lower Body Bathing: with supervision;sitting/lateral leans Pt Will Transfer to Toilet: with supervision;regular height toilet;ambulating Pt Will Perform Toileting - Clothing Manipulation and hygiene: with min assist;sit to/from stand;sitting/lateral leans Pt Will Perform Tub/Shower Transfer: with min guard assist;rolling walker;shower seat;ambulating  OT Frequency: Min 2X/week    Co-evaluation              AM-PAC OT "6 Clicks" Daily Activity     Outcome Measure Help from another person eating meals?: None Help from another person taking care of personal grooming?: None Help from another person toileting, which includes using toliet, bedpan, or urinal?: A Little Help from another person bathing (including washing, rinsing, drying)?: A Little Help from another person to put on and taking off regular upper body clothing?: None Help from another person to put on and taking off regular lower body clothing?: A Little 6 Click Score: 21   End of Session Equipment Utilized During Treatment: Gait belt;Rolling walker (2 wheels) Nurse Communication: Mobility status  Activity Tolerance: Patient tolerated treatment well Patient left: in chair;with call bell/phone within reach;with chair alarm set  OT Visit Diagnosis: Unsteadiness on feet (R26.81);Other abnormalities of gait and mobility (R26.89);Muscle weakness (generalized) (M62.81)                Time: 6195-0932 OT Time Calculation (min): 11 min Charges:  OT General Charges $OT  Visit: 1 Visit OT Evaluation $OT Eval Low Complexity: 1 Low  Lynnda Child, OTD, OTR/L Acute Rehab [phone]) [phone] - [place] 10/12/2021, 11:52 AM

## 2021-10-14 ENCOUNTER — Other Ambulatory Visit: Payer: Self-pay

## 2021-10-14 DIAGNOSIS — I10 Essential (primary) hypertension: Secondary | ICD-10-CM

## 2021-10-14 MED ORDER — AMLODIPINE BESYLATE 5 MG PO TABS: 5.0000 mg | ORAL_TABLET | Freq: Every day | ORAL | 2 refills | Status: DC

## 2021-10-15 ENCOUNTER — Encounter (HOSPITAL_COMMUNITY): Payer: Self-pay | Admitting: Orthopaedic Surgery

## 2021-10-17 NOTE — Discharge Summary (Signed)
Physician Discharge Summary  Patient ID: Nicole Hunt MRN: [id number] DOB/AGE: 06-09-1961 61 y.o.  Admit date: 10/11/2021 Discharge date: 10/12/2021  Admission Diagnoses: Right trimalleolar ankle fracture  Discharge Diagnoses:  Principal Problem:   Trimalleolar fracture of ankle, closed, right, initial encounter Active Problems:   Fracture   Discharged Condition: good  Hospital Course:  61 year old female presented to ED on 10/11/21 with right ankle injury.  She was walking her dog down a slope and lost her balance. She has hypertension - otherwise healthy. 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 She presented to [facility] ED where she was closed reduced and splinted. CT showed that she remained dislocated/subluxated at the right ankle. Therefore she was transferred to [facility] and admitted to Orthopaedics for planned ORIF on 10/12/21. She underwent this procedure without complications. Blistering was noted along longitudinal areas of pressure where the ED splint was pressing tightly. This was dressed during surgery. Swelling was minimal with excellent skin wrinkling and ORIF was performed without issues. The patient recovered well in PACU and then was cleared for discharge home same day.  Consults: None  Significant Diagnostic Studies: None  Treatments: surgery: 10/12/21 Open reduction internal fixation of right trimalleolar ankle fracture without fixation of posterior lip Open fixation of right ankle syndesmosis  Discharge Exam: Blood pressure 136/75, pulse 81, temperature 97.6 F (36.4 C), temperature source Oral, resp. rate 18, height 5\' 9"  (1.753 m), weight 90.7 kg, SpO2 97 %.  Gen: AAOx3, NAD Right lower extremity: Well padded short leg splint in place Wiggles toes SILT over toes CR<2s   Disposition: Discharge disposition: 01-Home or Self Care        Allergies as of 10/12/2021   No Known Allergies      Medication List     TAKE these  medications    losartan 25 MG tablet Commonly known as: COZAAR Take 25 mg by mouth daily.   Magnesium 250 MG Tabs Take 1 tablet (250 mg total) by mouth every evening. Take with evening meal.        Follow-up Information     Armond Hang, MD. Schedule an appointment as soon as possible for a visit on 10/14/2021.   Specialty: Orthopedic Surgery Contact information: [address] [place] [address] [phone]                 Signed: Armond Hang 10/17/2021, 1:51 AM

## 2021-10-23 ENCOUNTER — Other Ambulatory Visit: Payer: Self-pay

## 2021-10-23 DIAGNOSIS — I10 Essential (primary) hypertension: Secondary | ICD-10-CM

## 2021-10-23 MED ORDER — AMLODIPINE BESYLATE 5 MG PO TABS: 5.0000 mg | ORAL_TABLET | Freq: Every day | ORAL | 2 refills | Status: DC

## 2021-11-18 ENCOUNTER — Ambulatory Visit (INDEPENDENT_AMBULATORY_CARE_PROVIDER_SITE_OTHER): Admitting: Nurse Practitioner

## 2021-11-18 ENCOUNTER — Other Ambulatory Visit: Payer: Self-pay

## 2021-11-18 ENCOUNTER — Encounter: Payer: Self-pay | Admitting: Nurse Practitioner

## 2021-11-18 VITALS — BP 122/78 | HR 97 | Temp 98.2°F | Ht 69.0 in

## 2021-11-18 DIAGNOSIS — E7800 Pure hypercholesterolemia, unspecified: Secondary | ICD-10-CM | POA: Diagnosis not present

## 2021-11-18 DIAGNOSIS — M25511 Pain in right shoulder: Secondary | ICD-10-CM

## 2021-11-18 DIAGNOSIS — I10 Essential (primary) hypertension: Secondary | ICD-10-CM | POA: Diagnosis not present

## 2021-11-18 DIAGNOSIS — M79671 Pain in right foot: Secondary | ICD-10-CM | POA: Diagnosis not present

## 2021-11-18 DIAGNOSIS — S82891D Other fracture of right lower leg, subsequent encounter for closed fracture with routine healing: Secondary | ICD-10-CM

## 2021-11-18 DIAGNOSIS — G8929 Other chronic pain: Secondary | ICD-10-CM

## 2021-11-18 DIAGNOSIS — Z23 Encounter for immunization: Secondary | ICD-10-CM

## 2021-11-18 DIAGNOSIS — E2839 Other primary ovarian failure: Secondary | ICD-10-CM

## 2021-11-18 MED ORDER — GABAPENTIN 100 MG PO CAPS: 100.0000 mg | ORAL_CAPSULE | Freq: Every day | ORAL | 2 refills | Status: DC

## 2021-11-18 NOTE — Patient Instructions (Signed)

## 2021-11-18 NOTE — Progress Notes (Signed)
?This visit occurred during the SARS-CoV-2 public health emergency.  Safety protocols were in place, including screening questions prior to the visit, additional usage of staff PPE, and extensive cleaning of exam room while observing appropriate contact time as indicated for disinfecting solutions. ? ?Subjective:  ?  ? Patient ID: Nicole Hunt , female    DOB: 12-Jul-1961 , 61 y.o.   MRN: [id number] ? ? ?Chief Complaint  ?Patient presents with  ? Hypertension  ? Hyperlipidemia  ? ? ?HPI ? ?Patient is here for her blood pressure check. She recently broke her ankle walking the dog. Her orthopedic states that the pain that she is now having at night is most likely due to nerve pain and would like for her PCP to prescribe something for nerve pain.  She was hoping to get her cast off but has to stay on for 2 more weeks. The urologist feels the kidney cyst is getting smaller. She has been referred to GI.   ? ?Hypertension ?This is a chronic problem. The current episode started more than 1 month ago. The problem has been gradually improving. Associated symptoms include arthralgias (right shoulder injured while in the [place] worsened since being in the [place]) and numbness (tingling skin to right foot radiating up right leg). Pertinent negatives include no chest pain. She has tried calcium channel blockers and diuretics for the symptoms. The treatment provided moderate relief.   ? ?Past Medical History:  ?Diagnosis Date  ? Arthritis   ? Chronic kidney disease   ? 3A  ? Heart murmur   ? pt. states beign  ? Hypertension   ? Sleep apnea   ?  ? ?Family History  ?Problem Relation Age of Onset  ? Cancer Mother   ? Hypothyroidism Mother   ? Arthritis Mother   ? Stroke Father   ? Hypertension Father   ? Hypertension Sister   ? Hypertension Brother   ? Healthy Brother   ? Congestive Heart Failure Maternal Grandmother   ? ? ? ?Current Outpatient Medications:  ?  gabapentin (NEURONTIN) 100 MG capsule, Take 1 capsule (100 mg  total) by mouth at bedtime., Disp: 30 capsule, Rfl: 2 ?  amLODipine (NORVASC) 5 MG tablet, Take 1 tablet (5 mg total) by mouth daily., Disp: 30 tablet, Rfl: 2 ?  losartan (COZAAR) 25 MG tablet, Take 25 mg by mouth daily., Disp: , Rfl:  ?  Magnesium 250 MG TABS, Take 1 tablet (250 mg total) by mouth every evening. Take with evening meal., Disp: 90 tablet, Rfl: 1 ?  oxyCODONE (OXY IR/ROXICODONE) 5 MG immediate release tablet, Take 5 mg by mouth., Disp: , Rfl:   ? ?No Known Allergies  ? ?Review of Systems  ?Constitutional: Negative.   ?Respiratory: Negative.  Negative for shortness of breath.   ?Cardiovascular: Negative.  Negative for chest pain, palpitations and leg swelling.  ?Musculoskeletal:  Positive for arthralgias (right shoulder injured while in the [place] worsened since being in the [employer]).  ?Neurological:  Positive for numbness (tingling skin to right foot radiating up right leg).  ?Psychiatric/Behavioral: Negative.     ? ?Today's Vitals  ? 11/18/21 1031  ?BP: 122/78  ?Pulse: 97  ?Temp: 98.2 ?F (36.8 ?C)  ?TempSrc: Oral  ?Height: '5\' 9"'$  (1.753 m)  ? ?Body mass index is 29.53 kg/m?.  ? ?Objective:  ?Physical Exam ?Vitals reviewed.  ?Constitutional:   ?   General: She is not in acute distress. ?   Appearance: Normal appearance.  ?Cardiovascular:  ?  Rate and Rhythm: Normal rate and regular rhythm.  ?   Pulses: Normal pulses.  ?   Heart sounds: Normal heart sounds. No murmur heard. ?Pulmonary:  ?   Effort: Pulmonary effort is normal. No respiratory distress.  ?   Breath sounds: Normal breath sounds. No wheezing.  ?Musculoskeletal:  ?   Comments: Right foot with cast in place   ?Skin: ?   Capillary Refill: Capillary refill takes less than 2 seconds.  ?Neurological:  ?   General: No focal deficit present.  ?   Mental Status: She is alert and oriented to person, place, and time.  ?   Cranial Nerves: No cranial nerve deficit.  ?   Motor: No weakness.  ?Psychiatric:     ?   Mood and Affect: Mood normal.     ?    Behavior: Behavior normal.     ?   Thought Content: Thought content normal.     ?   Judgment: Judgment normal.  ?  ? ?   ?Assessment And Plan:  ?   ?1. Essential hypertension ?Comments: Blood pressure is normal, continue current medications.  ? ?2. Elevated cholesterol ?Comments: Slightly elevated LDL, continue to limit intake of fried and fatty foods. ?- Lipid panel ? ?3. Closed fracture of right ankle with routine healing, subsequent encounter ?Comments: Continue follow up with Orthopedics ?- DG Bone Density; Future ?- VITAMIN D 25 Hydroxy (Vit-D Deficiency, Fractures) ? ?4. Decreased estrogen level ?Comments: She has also had a recent fracture. Has never had done previously ?- DG Bone Density; Future ? ?5. Right foot pain ?Comments: I will treat for possible nerve pain with gabapentin, advised to avoid taking when operating heavy machinery or driving.  ?- gabapentin (NEURONTIN) 100 MG capsule; Take 1 capsule (100 mg total) by mouth at bedtime.  Dispense: 30 capsule; Refill: 2 ? ?6. Chronic right shoulder pain ?Comments: She is to discuss with orthopedics if needs referral to call back. Good ROM ? ?7. Need for shingles vaccine ?Comments: Given in office, 2nd dose ?- Varicella-zoster vaccine IM (Shingrix) ?  ? ? ?Patient was given opportunity to ask questions. Patient verbalized understanding of the plan and was able to repeat key elements of the plan. All questions were answered to their satisfaction.  ?Minette Brine, FNP  ? ?I, Minette Brine, FNP, have reviewed all documentation for this visit. The documentation on 11/18/21 for the exam, diagnosis, procedures, and orders are all accurate and complete.  ? ?IF YOU HAVE BEEN REFERRED TO A SPECIALIST, IT MAY TAKE 1-2 WEEKS TO SCHEDULE/PROCESS THE REFERRAL. IF YOU HAVE NOT HEARD FROM US/SPECIALIST IN TWO WEEKS, PLEASE GIVE [place] A CALL AT [phone] X [phone].  ? ?THE PATIENT IS ENCOURAGED TO PRACTICE SOCIAL DISTANCING DUE TO THE COVID-19 PANDEMIC.   ?

## 2021-11-19 LAB — LIPID PANEL
Chol/HDL Ratio: 4.1 ratio (ref 0.0–4.4)
Cholesterol, Total: 167 mg/dL (ref 100–199)
HDL: 41 mg/dL (ref >39)
LDL Chol Calc (NIH): 108 mg/dL — ABNORMAL HIGH (ref 0–99)
Triglycerides: 98 mg/dL (ref 0–149)
VLDL Cholesterol Cal: 18 mg/dL (ref 5–40)

## 2021-11-19 LAB — VITAMIN D 25 HYDROXY (VIT D DEFICIENCY, FRACTURES): Vit D, 25-Hydroxy: 17 ng/mL — ABNORMAL LOW (ref 30.0–100.0)

## 2021-11-21 ENCOUNTER — Other Ambulatory Visit: Payer: Self-pay | Admitting: Nurse Practitioner

## 2021-11-21 DIAGNOSIS — E559 Vitamin D deficiency, unspecified: Secondary | ICD-10-CM

## 2021-11-21 MED ORDER — VITAMIN D (ERGOCALCIFEROL) 1.25 MG (50000 UNIT) PO CAPS: 50000.0000 [IU] | ORAL_CAPSULE | ORAL | 1 refills | Status: DC

## 2021-11-26 ENCOUNTER — Encounter: Payer: Self-pay | Admitting: Internal Medicine

## 2021-11-27 ENCOUNTER — Telehealth: Payer: Self-pay

## 2021-11-27 NOTE — Telephone Encounter (Signed)
Case briefly reviewed. ?Patient has a cyst in the subdiaphragmatic region. ?Concern whether this is a pancreas cyst or a duplication cyst. ?She needs to be evaluated in clinic by Dr. Ardis Hughs or myself prior to any EUS consideration. ?If this truly is a duplication cyst, then the risk of aspiration leading to infection can be as high as 15%. ?Please set up a clinic visit with Dr. Ardis Hughs or myself (nonurgent) as the cyst has been present for over a year. ? ?Thanks. ?GM ? ?FYI DJ ?

## 2021-11-27 NOTE — Telephone Encounter (Signed)
Dr. Rush Landmark reviewed patient's MRI and advised that patient be seen by himself or Dr. Ardis Hughs seeing that an EUS may be necessary to further evaluate findings. Patient's appointment with Dr. Lorenso Courier has been cancelled for 4-5. Patient has been rescheduled with Dr. Rush Landmark on Wednesday, 01-01-22 at 3:10pm. Detailed message has been left for the patient on her mobile number with the new appointment information. She was encouraged to call back if she wishes to change this appointment date. New patient letter mailed to patient and went to MyChart  ?

## 2021-11-28 NOTE — Telephone Encounter (Signed)
Okay. ?Thanks for the update. ?GM ?

## 2021-12-02 ENCOUNTER — Other Ambulatory Visit: Payer: Self-pay | Admitting: Orthopaedic Surgery

## 2021-12-02 DIAGNOSIS — S82851D Displaced trimalleolar fracture of right lower leg, subsequent encounter for closed fracture with routine healing: Secondary | ICD-10-CM

## 2021-12-05 ENCOUNTER — Ambulatory Visit
Admission: RE | Admit: 2021-12-05 | Discharge: 2021-12-05 | Disposition: A | Discharge status: Home / Self Care | Source: Ambulatory Visit | Attending: Orthopaedic Surgery | Admitting: Orthopaedic Surgery

## 2021-12-05 DIAGNOSIS — S82851D Displaced trimalleolar fracture of right lower leg, subsequent encounter for closed fracture with routine healing: Secondary | ICD-10-CM

## 2021-12-05 IMAGING — CT CT ANKLE*R* W/O CM
3 series · 12 of 33 positions shown, 14 images · non-contrast
Comparison: Right ankle x-rays dated [DATE]. CT right
ankle dated [DATE].

CLINICAL DATA: Trimalleolar fracture follow-up.

EXAM:
CT OF THE RIGHT ANKLE WITHOUT CONTRAST
TECHNIQUE: Multidetector CT imaging of the right ankle was performed according
to the standard protocol. Multiplanar CT image reconstructions were
also generated.
RADIATION DOSE REDUCTION: This exam was performed according to the
departmental dose-optimization program which includes automated
exposure control, adjustment of the mA and/or kV according to
patient size and/or use of iterative reconstruction technique.

[Series 5: rt ankle 2.00 br40 s3 soft · axial · 0.30mm/px · z∈[+503,+665]mm · 4 of 121 slices shown, 5 images (1 of 3)]
[im 19/121  soft-tissue]
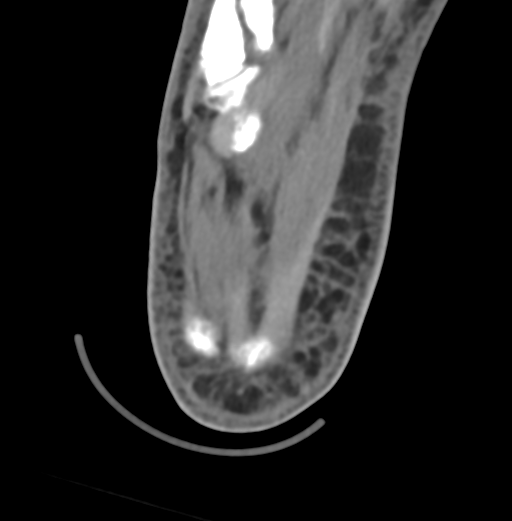
[im 19/121  bone]
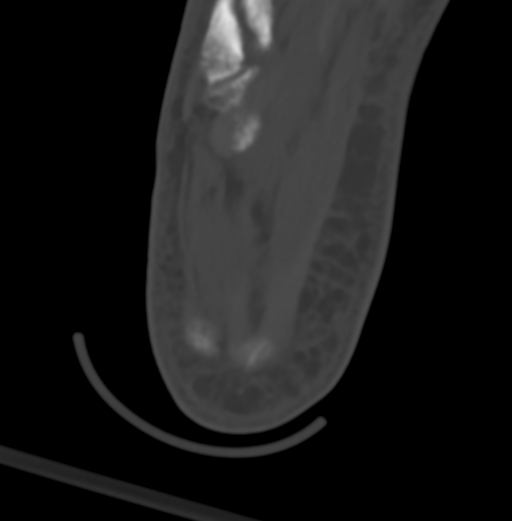
[im 47/121  bone]
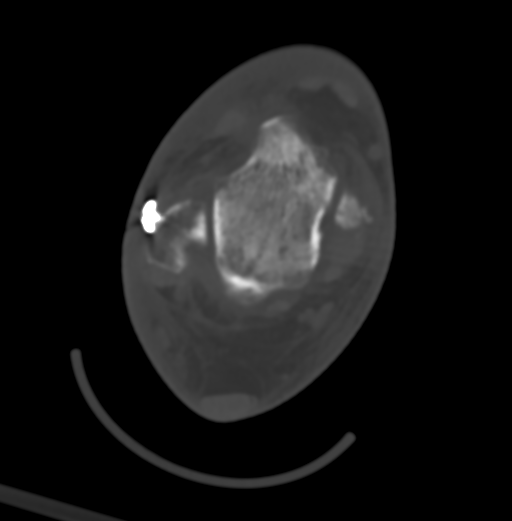
[im 74/121  bone]
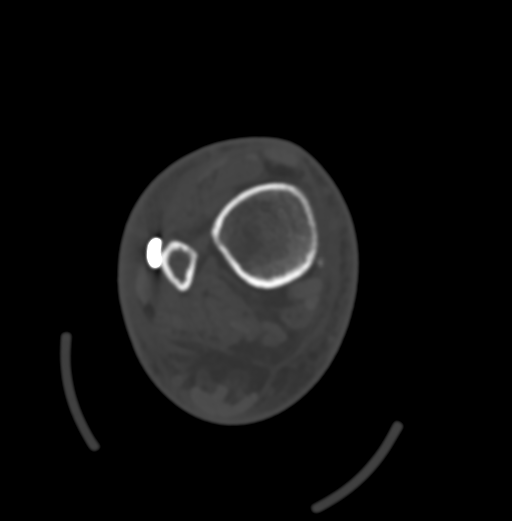
[im 102/121  bone]
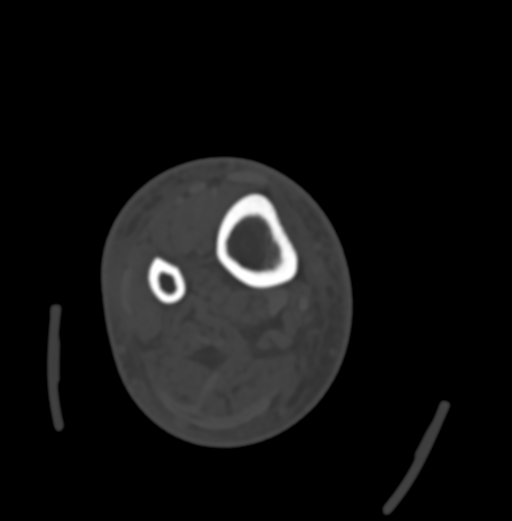

[Series 9: rt ankle 2.00 br40 s3 soft · coronal · 0.30mm/px · 3 of 77 slices shown (2 of 3)]
[im 16/77  bone]
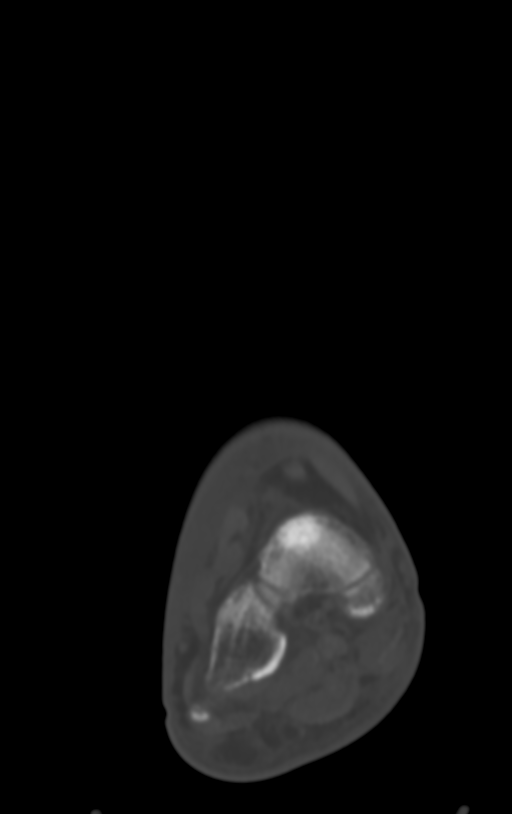
[im 31/77  bone]
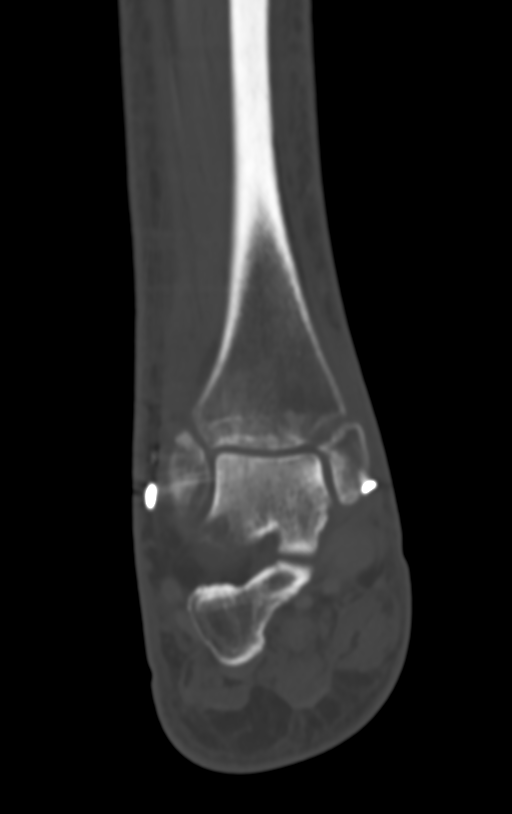
[im 46/77  bone]
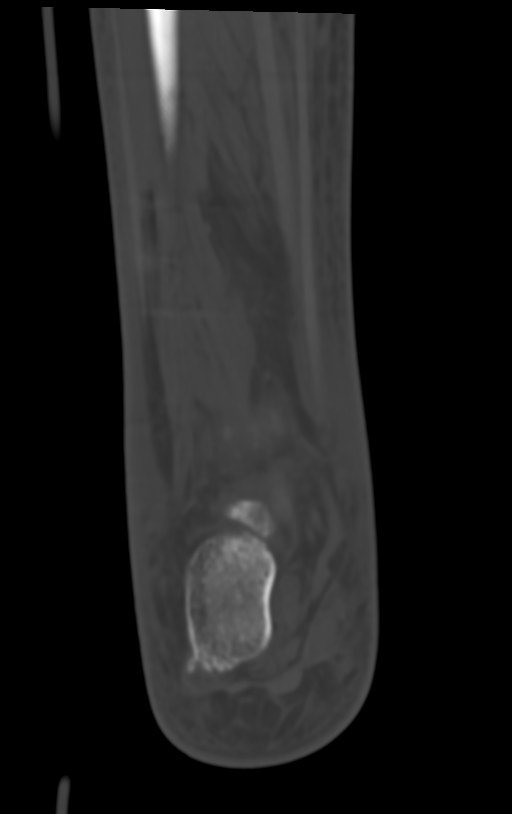

[Series 13: rt ankle 2.00 br40 s3 soft · sagittal · 0.30mm/px · 5 of 76 slices shown, 6 images (3 of 3)]
[im 26/76  bone]
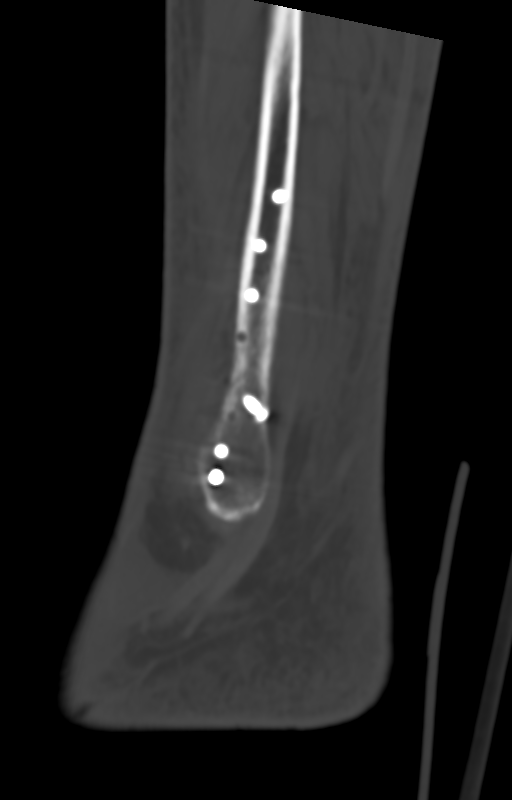
[im 32/76  bone]
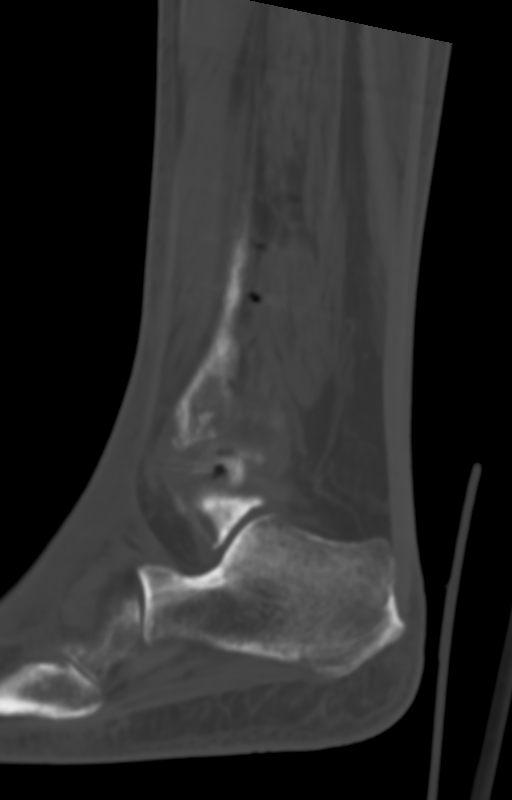
[im 38/76  soft-tissue]
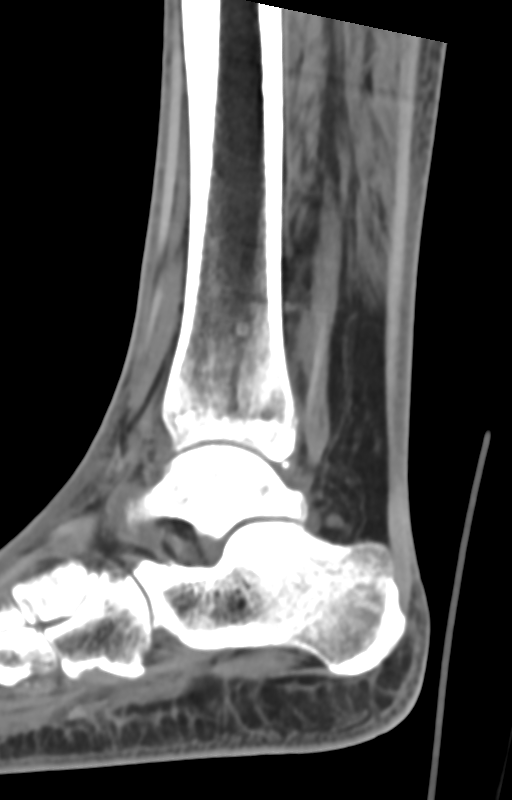
[im 38/76  bone]
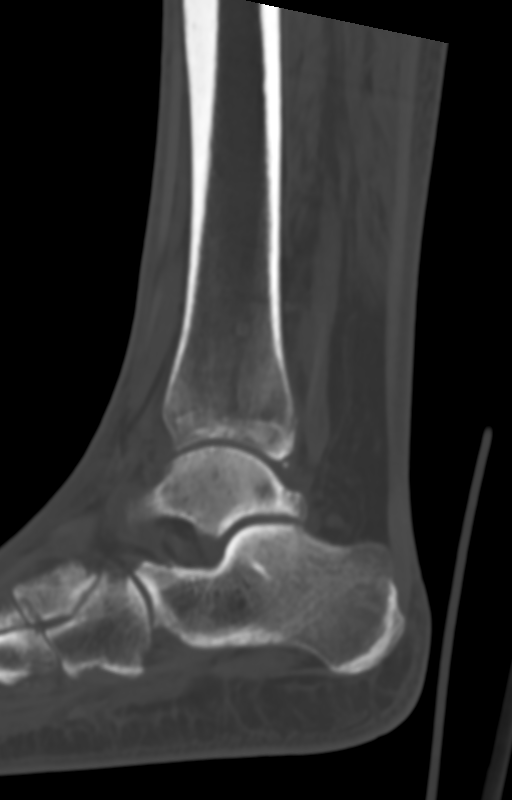
[im 44/76  bone]
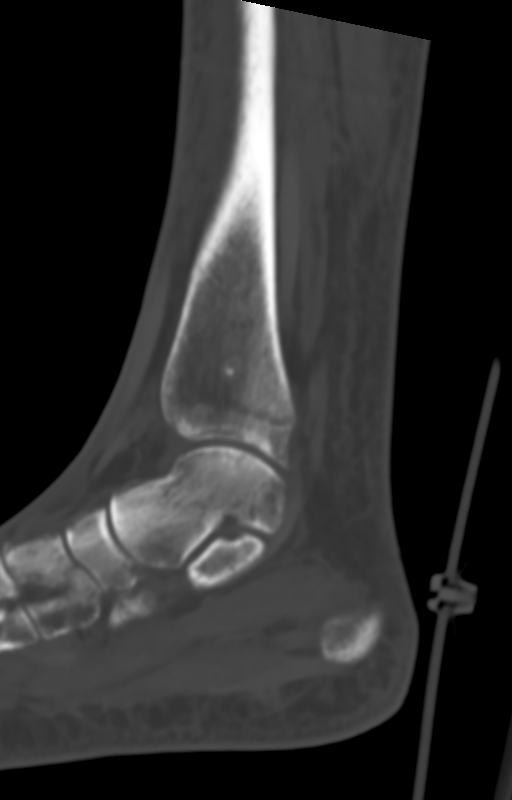
[im 51/76  bone]
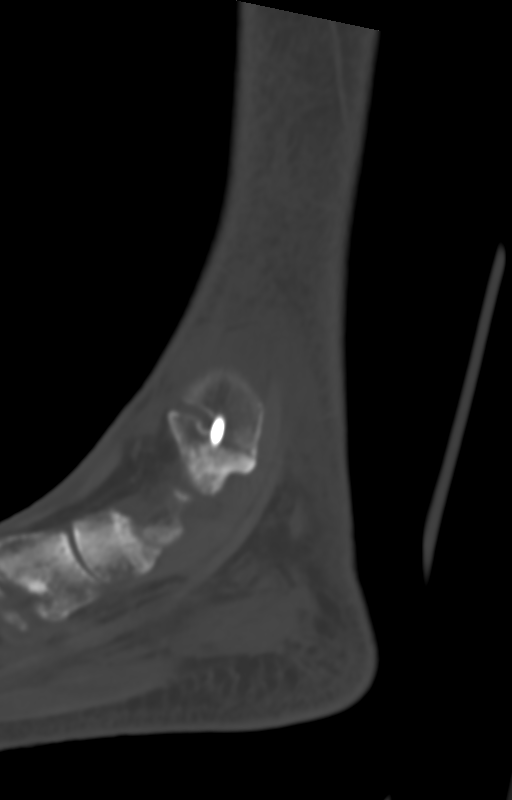

[12 of 33 positions shown; findings below may reference images not displayed]

FINDINGS: Bones/Joint/Cartilage

Medial malleolus fracture status post single screw fixation with
minimal bridging bone along the posterior margin of the fracture.
The anterior margin remains distracted 5-6 mm.

Lateral plate and screw fixation of the oblique distal fibula
fracture, which is nearly healed. Nearly healed posterior malleolar
fracture. Intact syndesmotic repair.

No evidence of hardware failure or loosening. No new fracture or
dislocation. Joint spaces are preserved. Small tibiotalar joint
effusion. Disuse osteopenia.

Ligaments

Ligaments are suboptimally evaluated by CT.

Muscles and Tendons
Grossly intact.

Soft tissue
Diffuse soft tissue swelling. No fluid collection or hematoma. No
soft tissue mass.
IMPRESSION: 1. Trimalleolar fracture status post ORIF and syndesmotic repair
with minimal healing of the medial malleolar fracture, which remains
largely nonunited. The distal fibula and posterior malleolar
fractures are nearly healed.

## 2021-12-12 ENCOUNTER — Encounter: Payer: Self-pay | Admitting: Nurse Practitioner

## 2021-12-13 ENCOUNTER — Other Ambulatory Visit: Payer: Self-pay

## 2021-12-13 ENCOUNTER — Encounter: Payer: Self-pay | Admitting: Pulmonary Disease

## 2021-12-17 ENCOUNTER — Ambulatory Visit (INDEPENDENT_AMBULATORY_CARE_PROVIDER_SITE_OTHER)

## 2021-12-17 MED ORDER — SODIUM CHLORIDE 0.9 % IV BOLUS
1000.0000 mL | Freq: Once | INTRAVENOUS | Status: AC
  Administered 2021-12-17: 1000 mL via INTRAVENOUS
  Filled 2021-12-17: qty 1000

## 2021-12-17 NOTE — Progress Notes (Signed)
Diagnosis: Hypercalcemia ? ?Provider:  Marshell Garfinkel, MD ? ?Procedure: Infusion ? ?IV Type: Peripheral, IV Location: R Forearm ? ?Entyvio (Vedolizumab), Normal saline, Dose: 1000 ml ? ?Infusion Start Time: 7579 ? ?Infusion Stop Time: 1410 ? ?Post Infusion IV Care: Peripheral IV Discontinued ? ?Discharge: Condition: Good, Destination: Home . AVS provided to patient.  ? ?Performed by:  Cleophus Molt, RN  ?  ?

## 2021-12-18 ENCOUNTER — Ambulatory Visit: Admitting: Internal Medicine

## 2022-01-01 ENCOUNTER — Encounter: Payer: Self-pay | Admitting: Pulmonary Disease

## 2022-01-01 ENCOUNTER — Encounter: Payer: Self-pay | Admitting: Gastroenterology

## 2022-01-01 ENCOUNTER — Other Ambulatory Visit: Payer: Self-pay | Admitting: Pharmacy Technician

## 2022-01-01 ENCOUNTER — Ambulatory Visit: Admitting: Gastroenterology

## 2022-01-01 VITALS — BP 124/80 | HR 73

## 2022-01-01 DIAGNOSIS — Z1211 Encounter for screening for malignant neoplasm of colon: Secondary | ICD-10-CM | POA: Diagnosis not present

## 2022-01-01 DIAGNOSIS — Q402 Other specified congenital malformations of stomach: Secondary | ICD-10-CM | POA: Diagnosis not present

## 2022-01-01 DIAGNOSIS — R935 Abnormal findings on diagnostic imaging of other abdominal regions, including retroperitoneum: Secondary | ICD-10-CM

## 2022-01-01 NOTE — Patient Instructions (Signed)
You have been scheduled for an endoscopy. Please follow written instructions given to you at your visit today. ?If you use inhalers (even only as needed), please bring them with you on the day of your procedure. ? ?Thank you for choosing me and [facility]. ? ?Dr. Rush Landmark ? ?

## 2022-01-02 ENCOUNTER — Encounter: Payer: Self-pay | Admitting: Gastroenterology

## 2022-01-02 DIAGNOSIS — Z1211 Encounter for screening for malignant neoplasm of colon: Secondary | ICD-10-CM | POA: Insufficient documentation

## 2022-01-02 DIAGNOSIS — R935 Abnormal findings on diagnostic imaging of other abdominal regions, including retroperitoneum: Secondary | ICD-10-CM | POA: Insufficient documentation

## 2022-01-02 DIAGNOSIS — Q402 Other specified congenital malformations of stomach: Secondary | ICD-10-CM | POA: Insufficient documentation

## 2022-01-02 NOTE — Progress Notes (Signed)
? ?GASTROENTEROLOGY OUTPATIENT CLINIC VISIT  ? ?Primary Care Provider ?Minette Brine, FNP ?[address] ?[place] [address] ?[phone] ? ?Referring Provider ?Minette Brine, [place] ?[address] ?[address] ?[place],  [place] [address] ?[phone] ? ?Patient Profile: ?Nicole Hunt is a 61 y.o. female with a pmh significant for CRI, hypertension, hyperlipidemia, OSA, renal cysts, recent ankle fracture, GERD.  The patient presents to the [facility] for an evaluation and management of problem(s) noted below: ? ?Problem List ?1. 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 Colon cancer screening   ? ? ?History of Present Illness ?This is the patient's first visit to the outpatient [facility] clinic.  The patient states that when she was in the [facility], she underwent her first screening colonoscopy and was told that she would need a 10-year follow-up.  This was done in 2017 so she will be due in 2027 for her next colon cancer screening.  She has undergone imaging over the course of last few years for follow-up of kidney cysts.  On more recent MRI abdomen imaging from January of this year, she was found to have an enlarging cyst in the region of the stomach/spleen.  Upon further review of the imaging, and prior imaging, she was found to have had this lesion previously but it has increased in size slightly since last imaging study.  The patient is referred for concern of a potential lymphocele or gastric duplication cyst or pancreatic pseudocyst for further evaluation and consideration of endoscopic ultrasound.  The patient states she had a prior history of GERD years ago as she was leaving the [facility] but now she has lost weight and no longer experiences GERD symptoms on a regular basis.  She denies any dysphagia or odynophagia.  She denies any significant abdominal pain.  She has never had an upper endoscopy.  She does not use significant  nonsteroidals on a regular basis. ? ?GI Review of Systems ?Positive as above ?Negative for dysphagia, odynophagia, decreased appetite, early satiety, bloating, change in bowel habits, melena, hematochezia ? ?Review of Systems ?General: Denies fevers/chills/weight loss unintentionally ?Cardiovascular: Denies chest pain ?Pulmonary: Denies shortness of breath ?Gastroenterological: See HPI ?Genitourinary: Denies darkened urine ?Hematological: Denies easy bruising/bleeding ?Dermatological: Denies jaundice ?Psychological: Mood is stable ? ? ?Medications ?Current Outpatient Medications  ?Medication Sig Dispense Refill  ? amLODipine (NORVASC) 5 MG tablet Take 1 tablet (5 mg total) by mouth daily. 30 tablet 2  ? aspirin 325 MG EC tablet Take 325 mg by mouth 2 (two) times daily.    ? docusate sodium (COLACE) 100 MG capsule Take 100 mg by mouth 2 (two) times daily.    ? losartan (COZAAR) 25 MG tablet Take 25 mg by mouth daily.    ? Magnesium 250 MG TABS Take 1 tablet (250 mg total) by mouth every evening. Take with evening meal. 90 tablet 1  ? ?No current facility-administered medications for this visit.  ? ? ?Allergies ?No Known Allergies ? ?Histories ?Past Medical History:  ?Diagnosis Date  ? Anemia   ? Arthritis   ? Chronic kidney disease   ? 3A  ? GERD (gastroesophageal reflux disease)   ? Heart murmur   ? pt. states beign  ? HLD (hyperlipidemia)   ? Hypertension   ? Kidney cysts   ? Sleep apnea   ? cpap  ? ?Past Surgical History:  ?Procedure Laterality Date  ? ORIF ANKLE FRACTURE Right 10/12/2021  ?  Procedure: OPEN REDUCTION INTERNAL FIXATION (ORIF) ANKLE FRACTURE WITH SYNDESMOSIS REPAIR;  Surgeon: Armond Hang, MD;  Location: [facility];  Service: Orthopedics;  Laterality: Right;  ? PARTIAL HYSTERECTOMY    ? ?Social History  ? ?Socioeconomic History  ? Marital status: Divorced  ?  Spouse name: Not on file  ? Number of children: 0  ? Years of education: Not on file  ? Highest education level: Not on file  ?Occupational  History  ? Occupation: retired  ?Tobacco Use  ? Smoking status: Never  ? Smokeless tobacco: Never  ?Vaping Use  ? Vaping Use: Never used  ?Substance and Sexual Activity  ? Alcohol use: Never  ? Drug use: Never  ? Sexual activity: Not on file  ?Other Topics Concern  ? Not on file  ?Social History Narrative  ? Not on file  ? ?Social Determinants of Health  ? ?Financial Resource Strain: Not on file  ?Food Insecurity: Not on file  ?Transportation Needs: Not on file  ?Physical Activity: Not on file  ?Stress: Not on file  ?Social Connections: Not on file  ?Intimate Partner Violence: Not on file  ? ?Family History  ?Problem Relation Age of Onset  ? Breast cancer Mother   ? Hypothyroidism Mother   ? Arthritis Mother   ? Stroke Father   ? Hypertension Father   ? Kidney disease Father   ? Hypertension Sister   ? Hypertension Brother   ? Healthy Brother   ? Congestive Heart Failure Maternal Grandmother   ? Colon cancer Neg Hx   ? Esophageal cancer Neg Hx   ? Inflammatory bowel disease Neg Hx   ? Liver disease Neg Hx   ? Pancreatic cancer Neg Hx   ? Rectal cancer Neg Hx   ? Stomach cancer Neg Hx   ? ?I have reviewed her medical, social, and family history in detail and updated the electronic medical record as necessary.  ? ? ?PHYSICAL EXAMINATION  ?BP 124/80 (BP Location: Left Arm, Patient Position: Sitting, Cuff Size: Normal)   Pulse 73  ?Wt Readings from Last 3 Encounters:  ?12/17/21 180 lb 12.8 oz (82 kg)  ?10/11/21 200 lb (90.7 kg)  ?08/19/21 212 lb (96.2 kg)  ?GEN: NAD, appears stated age, doesn't appear chronically ill ?PSYCH: Cooperative, without pressured speech ?EYE: Conjunctivae pink, sclerae anicteric ?ENT: MMM ?CV: Nontachycardic ?RESP: No audible wheezing ?GI: NABS, soft, NT/ND, without rebound or guarding, no HSM appreciated ?MSK/EXT: Right lower extremity in cast ?SKIN: No jaundice ?NEURO:  Alert & Oriented x 3, no focal deficits ? ? ?REVIEW OF DATA  ?I reviewed the following data at the time of this  encounter: ? ?GI Procedures and Studies  ?Reports a 2017 colonoscopy that was normal while she was in the [employer] ? ?Laboratory Studies  ?Reviewed those in epic ? ?Imaging Studies  ?1/23 MRI Abdomen ?IMPRESSION: ?1. The small interpolar renal lesion seen on prior study is ?decreased in size now measuring approximately 3 mm and while this is too small to accurately characterize there is no visible ?postcontrast enhancement on either today's MRI or MRI October 19, 2723 and almost certainly reflects a benign involuting ?hemorrhagic/proteinaceous cyst. Given the lesions small size and ?inability to definitively characterize this lesion consider a 1 year ?follow-up renal protocol MRI with and without contrast to assess ?stability. ?2. Similar left renal atrophy without hydronephrosis. ?3. Fluid density 5 cm lesion with thin walls and thin internal ?septations and no suspicious enhancement arising along the lateral aspect of  the stomach and within the splenic hilum, which in retrospect was present on prior imaging and has slightly increased in size, this is nonspecific with a primary differential ?consideration of a lymphocele with secondary differential ?considerations of a pseudocyst or duplication cyst. Consider ?attention on follow-up imaging versus further evaluation with EUS ?and fluid sampling if clinically indicated. ? ? ?ASSESSMENT  ?Ms. Seefeld is a 61 y.o. female with a pmh significant for CRI, hypertension, hyperlipidemia, OSA, renal cysts, recent ankle fracture, GERD.  The patient is seen today for evaluation and management of: ? ?1. 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 Colon cancer screening   ? ?The patient is hemodynamically and clinically stable.  She has evidence on MRI of the abdomen suggestive of a perigastric cyst concerning for potential gastric duplication cyst, lymphocele, pancreatic pseudocyst versus other etiology.  As it is grown in size, it does meet  criteria for consideration of an endoscopic ultrasound.  If this is truly a gastric duplication cyst, she has an increased risk of infectious complications if an aspiration does occur.  I think an endoscopic ultrasound is

## 2022-01-03 ENCOUNTER — Encounter: Payer: Self-pay | Admitting: Gastroenterology

## 2022-01-08 ENCOUNTER — Telehealth: Payer: Self-pay | Admitting: Gastroenterology

## 2022-01-08 NOTE — Telephone Encounter (Signed)
Inbound call from [place] from [facility] asking if we could fax over patients last OV note. Please advise.  ? ?[phone] ?

## 2022-01-08 NOTE — Telephone Encounter (Signed)
Office note faxed per request 

## 2022-03-04 ENCOUNTER — Encounter (HOSPITAL_COMMUNITY): Payer: Self-pay | Admitting: Gastroenterology

## 2022-03-06 ENCOUNTER — Encounter (HOSPITAL_COMMUNITY)
Admission: RE | Disposition: A | Discharge status: Home / Self Care | Payer: Self-pay | Source: Ambulatory Visit | Attending: Gastroenterology

## 2022-03-06 ENCOUNTER — Ambulatory Visit (HOSPITAL_COMMUNITY)
Admission: RE | Admit: 2022-03-06 | Discharge: 2022-03-06 | Disposition: A | Discharge status: Home / Self Care | Source: Ambulatory Visit | Attending: Gastroenterology | Admitting: Gastroenterology

## 2022-03-06 ENCOUNTER — Ambulatory Visit (HOSPITAL_BASED_OUTPATIENT_CLINIC_OR_DEPARTMENT_OTHER): Admitting: Anesthesiology

## 2022-03-06 ENCOUNTER — Ambulatory Visit (HOSPITAL_COMMUNITY): Admitting: Anesthesiology

## 2022-03-06 ENCOUNTER — Other Ambulatory Visit: Payer: Self-pay

## 2022-03-06 ENCOUNTER — Encounter (HOSPITAL_COMMUNITY): Payer: Self-pay | Admitting: Gastroenterology

## 2022-03-06 DIAGNOSIS — K449 Diaphragmatic hernia without obstruction or gangrene: Secondary | ICD-10-CM | POA: Diagnosis not present

## 2022-03-06 DIAGNOSIS — K2970 Gastritis, unspecified, without bleeding: Secondary | ICD-10-CM | POA: Diagnosis not present

## 2022-03-06 DIAGNOSIS — K259 Gastric ulcer, unspecified as acute or chronic, without hemorrhage or perforation: Secondary | ICD-10-CM

## 2022-03-06 DIAGNOSIS — K862 Cyst of pancreas: Secondary | ICD-10-CM

## 2022-03-06 DIAGNOSIS — K319 Disease of stomach and duodenum, unspecified: Secondary | ICD-10-CM | POA: Diagnosis not present

## 2022-03-06 DIAGNOSIS — D759 Disease of blood and blood-forming organs, unspecified: Secondary | ICD-10-CM | POA: Diagnosis not present

## 2022-03-06 DIAGNOSIS — D649 Anemia, unspecified: Secondary | ICD-10-CM | POA: Insufficient documentation

## 2022-03-06 DIAGNOSIS — G4730 Sleep apnea, unspecified: Secondary | ICD-10-CM | POA: Insufficient documentation

## 2022-03-06 DIAGNOSIS — R935 Abnormal findings on diagnostic imaging of other abdominal regions, including retroperitoneum: Secondary | ICD-10-CM | POA: Insufficient documentation

## 2022-03-06 DIAGNOSIS — Q402 Other specified congenital malformations of stomach: Secondary | ICD-10-CM

## 2022-03-06 HISTORY — PX: UPPER ESOPHAGEAL ENDOSCOPIC ULTRASOUND (EUS): SHX6562

## 2022-03-06 HISTORY — PX: BIOPSY: SHX5522

## 2022-03-06 HISTORY — PX: ESOPHAGOGASTRODUODENOSCOPY: SHX5428

## 2022-03-06 SURGERY — UPPER ESOPHAGEAL ENDOSCOPIC ULTRASOUND (EUS)
Anesthesia: Monitor Anesthesia Care

## 2022-03-06 MED ORDER — PHENYLEPHRINE 80 MCG/ML (10ML) SYRINGE FOR IV PUSH (FOR BLOOD PRESSURE SUPPORT)
PREFILLED_SYRINGE | INTRAVENOUS | Status: DC | PRN
  Administered 2022-03-06: 80 ug via INTRAVENOUS
  Administered 2022-03-06: 160 ug via INTRAVENOUS

## 2022-03-06 MED ORDER — OMEPRAZOLE 40 MG PO CPDR: 40.0000 mg | DELAYED_RELEASE_CAPSULE | Freq: Two times a day (BID) | ORAL | 12 refills | Status: DC

## 2022-03-06 MED ORDER — CIPROFLOXACIN IN D5W 400 MG/200ML IV SOLN
INTRAVENOUS | Status: DC | PRN
  Administered 2022-03-06: 400 mg via INTRAVENOUS

## 2022-03-06 MED ORDER — SODIUM CHLORIDE 0.9 % IV SOLN: INTRAVENOUS | Status: DC

## 2022-03-06 MED ORDER — PROPOFOL 500 MG/50ML IV EMUL
INTRAVENOUS | Status: DC | PRN
  Administered 2022-03-06: 150 ug/kg/min via INTRAVENOUS
  Administered 2022-03-06: 50 mg via INTRAVENOUS
  Administered 2022-03-06: 40 mg via INTRAVENOUS

## 2022-03-06 MED ORDER — CIPROFLOXACIN IN D5W 400 MG/200ML IV SOLN
INTRAVENOUS | Status: AC
  Filled 2022-03-06: qty 200

## 2022-03-06 NOTE — Anesthesia Postprocedure Evaluation (Signed)
Anesthesia Post Note  Patient: Almetta Liddicoat  Procedure(s) Performed: UPPER ESOPHAGEAL ENDOSCOPIC ULTRASOUND (EUS) BIOPSY ESOPHAGOGASTRODUODENOSCOPY (EGD)     Patient location during evaluation: PACU Anesthesia Type: MAC Level of consciousness: awake and alert Pain management: pain level controlled Vital Signs Assessment: post-procedure vital signs reviewed and stable Respiratory status: spontaneous breathing, nonlabored ventilation and respiratory function stable Cardiovascular status: blood pressure returned to baseline and stable Postop Assessment: no apparent nausea or vomiting Anesthetic complications: no   No notable events documented.  Last Vitals:  Vitals:   03/06/22 0948 03/06/22 1003  BP: 98/69 102/77  Pulse: 90 79  Resp: 12 15  Temp: 36.5 C 36.5 C  SpO2: 98% 100%    Last Pain:  Vitals:   03/06/22 0948  TempSrc:   PainSc: 0-No pain                 Lynda Rainwater

## 2022-03-06 NOTE — Transfer of Care (Signed)
Immediate Anesthesia Transfer of Care Note  Patient: Nicole Hunt  Procedure(s) Performed: UPPER ESOPHAGEAL ENDOSCOPIC ULTRASOUND (EUS) BIOPSY ESOPHAGOGASTRODUODENOSCOPY (EGD)  Patient Location: PACU  Anesthesia Type:MAC  Level of Consciousness: drowsy  Airway & Oxygen Therapy: Patient Spontanous Breathing and Patient connected to nasal cannula oxygen  Post-op Assessment: Report given to RN and Post -op Vital signs reviewed and stable  Post vital signs: Reviewed and stable  Last Vitals:  Vitals Value Taken Time  BP    Temp    Pulse 90 03/06/22 0947  Resp 12 03/06/22 0947  SpO2 98 % 03/06/22 0947  Vitals shown include unvalidated device data.  Last Pain:  Vitals:   03/06/22 0815  TempSrc: Temporal  PainSc: 0-No pain         Complications: No notable events documented.

## 2022-03-06 NOTE — Op Note (Signed)
[facility] Patient Name: Nicole Hunt Procedure Date : 03/06/2022 MRN: [id number] Attending MD: Justice Britain , MD Date of Birth: [birth year] CSN: [id number] Age: 61 Admit Type: Outpatient Procedure:                Upper EUS Indications:              Cyst seen on MRI Providers:                Justice Britain, MD, Burtis Junes, RN, Cletis Athens,                            Technician Referring MD:              Medicines:                Monitored Anesthesia Care Complications:            No immediate complications. Estimated Blood Loss:     Estimated blood loss was minimal. Procedure:                Pre-Anesthesia Assessment:                           - Prior to the procedure, a History and Physical                            was performed, and patient medications and                            allergies were reviewed. The patient's tolerance of                            previous anesthesia was also reviewed. The risks                            and benefits of the procedure and the sedation                            options and risks were discussed with the patient.                            All questions were answered, and informed consent                            was obtained. Prior Anticoagulants: The patient has                            taken no previous anticoagulant or antiplatelet                            agents. ASA Grade Assessment: II - A patient with                            mild systemic disease. After reviewing the risks  and benefits, the patient was deemed in                            satisfactory condition to undergo the procedure.                           After obtaining informed consent, the endoscope was                            passed under direct vision. Throughout the                            procedure, the patient's blood pressure, pulse, and                            oxygen saturations were  monitored continuously. The                            [id number]-[id number] ([id number]) Olympus endoscope was introduced                            through the mouth, and advanced to the second part                            of duodenum. The GF-[id number]-AL5 ([id number]) Olympus                            Radial EUS scope was introduced through the mouth,                            and advanced to the stomach for ultrasound                            examination. The [id number] ([id number]) Olympus linear                            ultrasound scope was introduced through the mouth,                            and advanced to the stomach for ultrasound                            examination. The upper EUS was accomplished without                            difficulty. The patient tolerated the procedure. Scope In: Scope Out: Findings:      ENDOSCOPIC FINDING: :      No gross lesions were noted in the entire esophagus.      The Z-line was regular and was found 35 cm from the incisors.      A 3 cm hiatal hernia was present.      One non-bleeding linear gastric ulcer with a clean ulcer base (Forrest       Class III) was found in the prepyloric region of  the stomach. The lesion       was 9 mm in largest dimension.      Patchy moderate inflammation characterized by erosions, erythema and       granularity was found in the entire examined stomach. Biopsies were       taken with a cold forceps for histology and Helicobacter pylori testing.      No gross lesions were noted in the duodenal bulb, in the first portion       of the duodenum and in the second portion of the duodenum.      ENDOSONOGRAPHIC FINDING: :      An anechoic lesion suggestive of a cyst was identified in the       peripancreatic tail region/perisplenic region. The lesion measured 41 mm       by 20 mm in maximal cross-sectional diameter at that area but I could       see that it was longer in width by moving echoendoscope. There was a       single  compartment without septae. The outer wall of the lesion was       thick and had at least 3-layers to it. There was no associated mass.       There was no internal debris within the fluid-filled cavity.      No malignant-appearing lymph nodes were visualized in the gastrohepatic       ligament (level 18), celiac region (level 20) and peripancreatic region.      There was no sign of significant endosonographic abnormality in the genu       of the pancreas, pancreatic body and pancreatic tail. No masses in this       area that was visualized.      Endosonographic imaging in the cardia of the stomach, in the fundus of       the stomach and in the body of the stomach showed no extrinsic       compression, intramural (subepithelial) lesion, mass, varices or wall       thickening.      Endosonographic imaging in the visualized portion of the liver showed no       mass.      The celiac region was visualized. Impression:               EGD Impression:                           - No gross lesions in esophagus. Z-line regular, 35                            cm from the incisors.                           - 3 cm hiatal hernia.                           - Non-bleeding gastric ulcer with a clean ulcer                            base (Forrest Class III) in prepylorus. Gastritis.                            Biopsied.                           -  No gross lesions in the duodenal bulb, in the                            first portion of the duodenum and in the second                            portion of the duodenum.                           EUS Impression:                           - A cystic lesion was seen in the                            peripancreatic/perisplenic region. Tissue has not                            been obtained. However, the endosonographic                            appearance is highly suspicious for a duplication                            cyst.                           - No  malignant-appearing lymph nodes were                            visualized in the gastrohepatic ligament (level                            18), celiac region (level 20) and peripancreatic                            region.                           - There was no sign of significant pathology in the                            genu of the pancreas, pancreatic body and                            pancreatic tail. Recommendation:           - The patient will be observed post-procedure,                            until all discharge criteria are met.                           - Discharge patient to home.                           -  Patient has a contact number available for                            emergencies. The signs and symptoms of potential                            delayed complications were discussed with the                            patient. Return to normal activities tomorrow.                            Written discharge instructions were provided to the                            patient.                           - Resume previous diet.                           - Observe patient's clinical course.                           - Await path results.                           - Start Omeprazole 40 mg twice daily for 4-month                            and then continue once daily.                           - Repeat EGD in 477-monthto ensure healing of                            gastric ulcer/gastritis.                           - Repeat cross-sectional imaging in 1263-months                            long as patient remains asymptomatic (CT-Abdomen IV                            contrast) to re-evaluate. If there is increase in                            size, will need to consider increased risk FNA of                            lesion in future.                           - Will discuss with patient's findings at an  upcoming [place] to determine if any  other follow up                            may be required other than what is outlined.                           - The findings and recommendations were discussed                            with the patient.                           - The findings and recommendations were discussed                            with the patient's family. Procedure Code(s):        --- Professional ---                           [phone], Esophagogastroduodenoscopy, flexible,                            transoral; with endoscopic ultrasound examination                            limited to the esophagus, stomach or duodenum, and                            adjacent structures                           43239, Esophagogastroduodenoscopy, flexible,                            transoral; with biopsy, single or multiple Diagnosis Code(s):        --- Professional ---                           K44.9, Diaphragmatic hernia without obstruction or                            gangrene                           K25.9, Gastric ulcer, unspecified as acute or                            chronic, without hemorrhage or perforation                           K29.70, Gastritis, unspecified, without bleeding                           K86.2, Cyst of pancreas                           I89.9, Noninfective disorder of lymphatic  vessels                            and lymph nodes, unspecified                           R93.89, Abnormal findings on diagnostic imaging of                            other specified body structures CPT copyright 2019 American Medical Association. All rights reserved. The codes documented in this report are preliminary and upon coder review may  be revised to meet current compliance requirements. Justice Britain, MD 03/06/2022 10:11:35 AM Number of Addenda: 0

## 2022-03-06 NOTE — H&P (Signed)
GASTROENTEROLOGY PROCEDURE H&P NOTE   Primary Care Physician: Minette Brine, FNP  HPI: Nicole Hunt is a 61 y.o. 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  Past Medical History:  Diagnosis Date   Anemia    Arthritis    Chronic kidney disease    3A   GERD (gastroesophageal reflux disease)    Heart murmur    pt. states beign   HLD (hyperlipidemia)    Hypertension    Kidney cysts    Sleep apnea    cpap   Past Surgical History:  Procedure Laterality Date   ORIF ANKLE FRACTURE Right 10/12/2021   Procedure: OPEN REDUCTION INTERNAL FIXATION (ORIF) ANKLE FRACTURE WITH SYNDESMOSIS REPAIR;  Surgeon: Armond Hang, MD;  Location: [facility];  Service: Orthopedics;  Laterality: Right;   PARTIAL HYSTERECTOMY     Current Facility-Administered Medications  Medication Dose Route Frequency Provider Last Rate Last Admin   0.9 %  sodium chloride infusion   Intravenous Continuous Mansouraty, Telford Nab., MD 20 mL/hr at 03/06/22 0831 New Bag at 03/06/22 0831    Current Facility-Administered Medications:    0.9 %  sodium chloride infusion, , Intravenous, Continuous, Mansouraty, Telford Nab., MD, Last Rate: 20 mL/hr at 03/06/22 0831, New Bag at 03/06/22 0831 No Known Allergies Family History  Problem Relation Age of Onset   Breast cancer Mother    Hypothyroidism Mother    Arthritis Mother    Stroke Father    Hypertension Father    Kidney disease Father    Hypertension Sister    Hypertension Brother    Healthy Brother    Congestive Heart Failure Maternal Grandmother    Colon cancer Neg Hx    Esophageal cancer Neg Hx    Inflammatory bowel disease Neg Hx    Liver disease Neg Hx    Pancreatic cancer Neg Hx    Rectal cancer Neg Hx    Stomach cancer Neg Hx    Social History   Socioeconomic History   Marital status: Divorced    Spouse name: Not on file   Number of children: 0   Years of education: Not on file    Highest education level: Not on file  Occupational History   Occupation: retired  Tobacco Use   Smoking status: Never   Smokeless tobacco: Never  Vaping Use   Vaping Use: Never used  Substance and Sexual Activity   Alcohol use: Never   Drug use: Never   Sexual activity: Not on file  Other Topics Concern   Not on file  Social History Narrative   Not on file   Social Determinants of Health   Financial Resource Strain: Not on file  Food Insecurity: Not on file  Transportation Needs: Not on file  Physical Activity: Not on file  Stress: Not on file  Social Connections: Not on file  Intimate Partner Violence: Not on file    Physical Exam: Today's Vitals   03/06/22 0815  BP: 139/77  Pulse: 77  Resp: 19  Temp: 98.3 F (36.8 C)  TempSrc: Temporal  SpO2: 100%  Weight: 81.6 kg  Height: '5\' 9"'$  (1.753 m)  PainSc: 0-No pain   Body mass index is 26.58 kg/m. GEN: NAD EYE: Sclerae anicteric ENT: MMM CV: Non-tachycardic GI: Soft, NT/ND NEURO:  Alert & Oriented x 3  Lab Results: No results for input(s): "WBC", "HGB", "HCT", "PLT" in the last 72 hours. BMET No results for input(s): "NA", "K", "CL", "CO2", "  GLUCOSE", "BUN", "CREATININE", "CALCIUM" in the last 72 hours. LFT No results for input(s): "PROT", "ALBUMIN", "AST", "ALT", "ALKPHOS", "BILITOT", "BILIDIR", "IBILI" in the last 72 hours. PT/INR No results for input(s): "LABPROT", "INR" in the last 72 hours.   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  The risks and benefits of endoscopic evaluation/treatment were discussed with the patient and/or family; these include but are not limited to the risk of perforation, infection, bleeding, missed lesions, lack of diagnosis, severe illness requiring hospitalization, as well as anesthesia and sedation related illnesses.  The patient's history has been reviewed, patient  examined, no change in status, and deemed stable for procedure.  The patient and/or family is agreeable to proceed.    Justice Britain, MD [facility] Office # [phone]

## 2022-03-07 ENCOUNTER — Encounter (HOSPITAL_COMMUNITY): Payer: Self-pay | Admitting: Gastroenterology

## 2022-03-07 LAB — SURGICAL PATHOLOGY

## 2022-03-18 ENCOUNTER — Encounter: Payer: Self-pay | Admitting: Gastroenterology

## 2022-03-20 ENCOUNTER — Encounter: Payer: Self-pay | Admitting: Nurse Practitioner

## 2022-03-20 ENCOUNTER — Ambulatory Visit: Admitting: Nurse Practitioner

## 2022-03-20 VITALS — BP 128/70 | HR 74 | Temp 98.1°F | Ht 69.0 in | Wt 174.6 lb

## 2022-03-20 DIAGNOSIS — I10 Essential (primary) hypertension: Secondary | ICD-10-CM | POA: Diagnosis not present

## 2022-03-20 DIAGNOSIS — M25511 Pain in right shoulder: Secondary | ICD-10-CM | POA: Diagnosis not present

## 2022-03-20 MED ORDER — DICLOFENAC SODIUM 1 % EX GEL: 2.0000 g | Freq: Four times a day (QID) | CUTANEOUS | 2 refills | Status: DC

## 2022-03-20 NOTE — Patient Instructions (Signed)
Hypertension, Adult ?Hypertension is another name for high blood pressure. High blood pressure forces your heart to work harder to pump blood. This can cause problems over time. ?There are two numbers in a blood pressure reading. There is a top number (systolic) over a bottom number (diastolic). It is best to have a blood pressure that is below 120/80. ?What are the causes? ?The cause of this condition is not known. Some other conditions can lead to high blood pressure. ?What increases the risk? ?Some lifestyle factors can make you more likely to develop high blood pressure: ?Smoking. ?Not getting enough exercise or physical activity. ?Being overweight. ?Having too much fat, sugar, calories, or salt (sodium) in your diet. ?Drinking too much alcohol. ?Other risk factors include: ?Having any of these conditions: ?Heart disease. ?Diabetes. ?High cholesterol. ?Kidney disease. ?Obstructive sleep apnea. ?Having a family history of high blood pressure and high cholesterol. ?Age. The risk increases with age. ?Stress. ?What are the signs or symptoms? ?High blood pressure may not cause symptoms. Very high blood pressure (hypertensive crisis) may cause: ?Headache. ?Fast or uneven heartbeats (palpitations). ?Shortness of breath. ?Nosebleed. ?Vomiting or feeling like you may vomit (nauseous). ?Changes in how you see. ?Very bad chest pain. ?Feeling dizzy. ?Seizures. ?How is this treated? ?This condition is treated by making healthy lifestyle changes, such as: ?Eating healthy foods. ?Exercising more. ?Drinking less alcohol. ?Your doctor may prescribe medicine if lifestyle changes do not help enough and if: ?Your top number is above 130. ?Your bottom number is above 80. ?Your personal target blood pressure may vary. ?Follow these instructions at home: ?Eating and drinking ? ?If told, follow the DASH eating plan. To follow this plan: ?Fill one half of your plate at each meal with fruits and vegetables. ?Fill one fourth of your plate  at each meal with whole grains. Whole grains include whole-wheat pasta, brown rice, and whole-grain bread. ?Eat or drink low-fat dairy products, such as skim milk or low-fat yogurt. ?Fill one fourth of your plate at each meal with low-fat (lean) proteins. Low-fat proteins include fish, chicken without skin, eggs, beans, and tofu. ?Avoid fatty meat, cured and processed meat, or chicken with skin. ?Avoid pre-made or processed food. ?Limit the amount of salt in your diet to less than 1,500 mg each day. ?Do not drink alcohol if: ?Your doctor tells you not to drink. ?You are pregnant, may be pregnant, or are planning to become pregnant. ?If you drink alcohol: ?Limit how much you have to: ?0-1 drink a day for women. ?0-2 drinks a day for men. ?Know how much alcohol is in your drink. In the [place] one drink equals one 12 oz bottle of beer (355 mL), one 5 oz glass of wine (148 mL), or one 1? oz glass of hard liquor (44 mL). ?Lifestyle ? ?Work with your doctor to stay at a healthy weight or to lose weight. Ask your doctor what the best weight is for you. ?Get at least 30 minutes of exercise that causes your heart to beat faster (aerobic exercise) most days of the week. This may include walking, swimming, or biking. ?Get at least 30 minutes of exercise that strengthens your muscles (resistance exercise) at least 3 days a week. This may include lifting weights or doing Pilates. ?Do not smoke or use any products that contain nicotine or tobacco. If you need help quitting, ask your doctor. ?Check your blood pressure at home as told by your doctor. ?Keep all follow-up visits. ?Medicines ?Take over-the-counter and prescription medicines   only as told by your doctor. Follow directions carefully. ?Do not skip doses of blood pressure medicine. The medicine does not work as well if you skip doses. Skipping doses also puts you at risk for problems. ?Ask your doctor about side effects or reactions to medicines that you should watch  for. ?Contact a doctor if: ?You think you are having a reaction to the medicine you are taking. ?You have headaches that keep coming back. ?You feel dizzy. ?You have swelling in your ankles. ?You have trouble with your vision. ?Get help right away if: ?You get a very bad headache. ?You start to feel mixed up (confused). ?You feel weak or numb. ?You feel faint. ?You have very bad pain in your: ?Chest. ?Belly (abdomen). ?You vomit more than once. ?You have trouble breathing. ?These symptoms may be an emergency. Get help right away. Call 911. ?Do not wait to see if the symptoms will go away. ?Do not drive yourself to the hospital. ?Summary ?Hypertension is another name for high blood pressure. ?High blood pressure forces your heart to work harder to pump blood. ?For most people, a normal blood pressure is less than 120/80. ?Making healthy choices can help lower blood pressure. If your blood pressure does not get lower with healthy choices, you may need to take medicine. ?This information is not intended to replace advice given to you by your health care provider. Make sure you discuss any questions you have with your health care provider. ?Document Revised: 06/20/2021 Document Reviewed: 06/20/2021 ?Elsevier Patient Education ? 2023 [employer] ? ?

## 2022-03-20 NOTE — Progress Notes (Signed)
Barnet Glasgow Martin,acting as a Education administrator for Minette Brine, FNP.,have documented all relevant documentation on the behalf of Minette Brine, FNP,as directed by  Minette Brine, FNP while in the presence of Minette Brine, [place].  Subjective:     Patient ID: Nicole Hunt , female    DOB: 1961-01-02 , 61 y.o.   MRN: [id number]   Chief Complaint  Patient presents with   Hypertension    HPI  Patient presents today for a bp check. Patient has no other issues. Patient states she was put on Vitamin D here but Kidney doctor took her off because her calcium was up. Patient states her right shoulder is a 6/10 with movement, she is going to [facility] for her foot.   She is eating vegetables and plant based protein  Wt Readings from Last 3 Encounters: 03/20/22 : 174 lb 9.6 oz (79.2 kg) 03/06/22 : 180 lb (81.6 kg) 12/17/21 : 180 lb 12.8 oz (82 kg)  She has just started going back to the gym this week.  She had an endoscopy 2 weeks ago thought to be a duplicate. She is being treated for an ulcer. She is     Hypertension This is a chronic problem. The current episode started more than 1 month ago. The problem has been gradually improving since onset. Pertinent negatives include no chest pain, palpitations or shortness of breath. There are no associated agents to hypertension. Past treatments include calcium channel blockers and diuretics. The current treatment provides moderate improvement. There are no compliance problems.  There is no history of chronic renal disease.     Past Medical History:  Diagnosis Date   Anemia    Arthritis    Chronic kidney disease    3A   GERD (gastroesophageal reflux disease)    Heart murmur    pt. states beign   HLD (hyperlipidemia)    Hypertension    Kidney cysts    Sleep apnea    cpap     Family History  Problem Relation Age of Onset   Breast cancer Mother    Hypothyroidism Mother    Arthritis Mother    Stroke Father    Hypertension Father    Kidney  disease Father    Hypertension Sister    Hypertension Brother    Healthy Brother    Congestive Heart Failure Maternal Grandmother    Colon cancer Neg Hx    Esophageal cancer Neg Hx    Inflammatory bowel disease Neg Hx    Liver disease Neg Hx    Pancreatic cancer Neg Hx    Rectal cancer Neg Hx    Stomach cancer Neg Hx      Current Outpatient Medications:    amLODipine (NORVASC) 5 MG tablet, Take 5 mg by mouth daily., Disp: , Rfl:    diclofenac Sodium (VOLTAREN) 1 % GEL, Apply 2 g topically 4 (four) times daily., Disp: 100 g, Rfl: 2   losartan (COZAAR) 25 MG tablet, Take 25 mg by mouth daily., Disp: , Rfl:    Magnesium 250 MG TABS, Take 1 tablet (250 mg total) by mouth every evening. Take with evening meal., Disp: 90 tablet, Rfl: 1   omeprazole (PRILOSEC) 40 MG capsule, Take 1 capsule (40 mg total) by mouth 2 (two) times daily before a meal., Disp: 60 capsule, Rfl: 12   No Known Allergies   Review of Systems  Constitutional: Negative.   HENT: Negative.    Eyes: Negative.   Respiratory: Negative.  Negative for  shortness of breath.   Cardiovascular: Negative.  Negative for chest pain and palpitations.  Gastrointestinal: Negative.   Endocrine: Negative.   Genitourinary: Negative.   Musculoskeletal: Negative.   Skin: Negative.   Allergic/Immunologic: Negative.   Neurological: Negative.   Hematological: Negative.   Psychiatric/Behavioral: Negative.       Today's Vitals   03/20/22 1024  BP: 128/70  Pulse: 74  Temp: 98.1 F (36.7 C)  TempSrc: Oral  Weight: 174 lb 9.6 oz (79.2 kg)  Height: '5\' 9"'$  (1.753 m)  PainSc: 6   PainLoc: Shoulder   Body mass index is 25.78 kg/m.  Wt Readings from Last 3 Encounters:  03/20/22 174 lb 9.6 oz (79.2 kg)  03/06/22 180 lb (81.6 kg)  12/17/21 180 lb 12.8 oz (82 kg)    Objective:  Physical Exam Vitals reviewed.  Constitutional:      General: She is not in acute distress.    Appearance: Normal appearance.  Cardiovascular:     Rate  and Rhythm: Normal rate and regular rhythm.     Pulses: Normal pulses.     Heart sounds: Normal heart sounds. No murmur heard. Pulmonary:     Effort: Pulmonary effort is normal. No respiratory distress.     Breath sounds: Normal breath sounds. No wheezing.  Musculoskeletal:        General: Tenderness (right shoulder) present. No swelling or deformity. Normal range of motion.     Right lower leg: No edema.     Left lower leg: No edema.  Skin:    General: Skin is warm and dry.     Capillary Refill: Capillary refill takes less than 2 seconds.  Neurological:     General: No focal deficit present.     Mental Status: She is alert and oriented to person, place, and time.     Cranial Nerves: No cranial nerve deficit.     Motor: No weakness.  Psychiatric:        Mood and Affect: Mood normal.        Behavior: Behavior normal.        Thought Content: Thought content normal.        Judgment: Judgment normal.         Assessment And Plan:     1. Essential hypertension Comments: Blood pressure is well controlled, continue current medications  2. Acute pain of right shoulder Comments: Tenderness with ROM, at this time does not want any additional imaging and the discomfort is manageable. - DG Shoulder Left; Future     Patient was given opportunity to ask questions. Patient verbalized understanding of the plan and was able to repeat key elements of the plan. All questions were answered to their satisfaction.  Minette Brine, FNP    I, Minette Brine, FNP, have reviewed all documentation for this visit. The documentation on 03/20/22 for the exam, diagnosis, procedures, and orders are all accurate and complete.  IF YOU HAVE BEEN REFERRED TO A SPECIALIST, IT MAY TAKE 1-2 WEEKS TO SCHEDULE/PROCESS THE REFERRAL. IF YOU HAVE NOT HEARD FROM US/SPECIALIST IN TWO WEEKS, PLEASE GIVE [place] A CALL AT [phone].   THE PATIENT IS ENCOURAGED TO PRACTICE SOCIAL DISTANCING DUE TO THE COVID-19 PANDEMIC.

## 2022-04-15 ENCOUNTER — Telehealth: Payer: Self-pay | Admitting: Hematology

## 2022-04-15 NOTE — Telephone Encounter (Signed)
Scheduled appt per 8/1 referral. Pt is aware of appt date and time. Pt is aware to arrive 15 mins prior to appt time and to bring and updated insurance card. Pt is aware of appt location.   

## 2022-04-18 ENCOUNTER — Other Ambulatory Visit: Payer: Self-pay | Admitting: Nurse Practitioner

## 2022-04-18 ENCOUNTER — Encounter: Payer: Self-pay | Admitting: Nurse Practitioner

## 2022-04-18 DIAGNOSIS — Z1231 Encounter for screening mammogram for malignant neoplasm of breast: Secondary | ICD-10-CM

## 2022-05-02 ENCOUNTER — Ambulatory Visit
Admission: RE | Admit: 2022-05-02 | Discharge: 2022-05-02 | Disposition: A | Discharge status: Home / Self Care | Source: Ambulatory Visit | Attending: Nurse Practitioner | Admitting: Nurse Practitioner

## 2022-05-02 DIAGNOSIS — S82891D Other fracture of right lower leg, subsequent encounter for closed fracture with routine healing: Secondary | ICD-10-CM

## 2022-05-02 DIAGNOSIS — E2839 Other primary ovarian failure: Secondary | ICD-10-CM

## 2022-05-07 ENCOUNTER — Telehealth: Payer: Self-pay | Admitting: Hematology and Oncology

## 2022-05-07 NOTE — Telephone Encounter (Signed)
R/s pt's appt time to an earlier time. Pt is aware of new appt time.

## 2022-05-09 ENCOUNTER — Other Ambulatory Visit: Payer: Self-pay

## 2022-05-09 ENCOUNTER — Inpatient Hospital Stay: Attending: Hematology | Admitting: Hematology and Oncology

## 2022-05-09 ENCOUNTER — Inpatient Hospital Stay: Admitting: Hematology

## 2022-05-09 ENCOUNTER — Inpatient Hospital Stay

## 2022-05-09 VITALS — BP 154/80 | HR 56 | Temp 97.6°F | Resp 18 | Ht 69.0 in | Wt 181.8 lb

## 2022-05-09 DIAGNOSIS — Z803 Family history of malignant neoplasm of breast: Secondary | ICD-10-CM | POA: Diagnosis not present

## 2022-05-09 DIAGNOSIS — E785 Hyperlipidemia, unspecified: Secondary | ICD-10-CM | POA: Diagnosis not present

## 2022-05-09 DIAGNOSIS — R768 Other specified abnormal immunological findings in serum: Secondary | ICD-10-CM

## 2022-05-09 DIAGNOSIS — N189 Chronic kidney disease, unspecified: Secondary | ICD-10-CM | POA: Insufficient documentation

## 2022-05-09 DIAGNOSIS — Z79899 Other long term (current) drug therapy: Secondary | ICD-10-CM | POA: Diagnosis not present

## 2022-05-09 DIAGNOSIS — I129 Hypertensive chronic kidney disease with stage 1 through stage 4 chronic kidney disease, or unspecified chronic kidney disease: Secondary | ICD-10-CM | POA: Insufficient documentation

## 2022-05-09 DIAGNOSIS — K219 Gastro-esophageal reflux disease without esophagitis: Secondary | ICD-10-CM | POA: Diagnosis not present

## 2022-05-09 DIAGNOSIS — G4730 Sleep apnea, unspecified: Secondary | ICD-10-CM | POA: Insufficient documentation

## 2022-05-09 LAB — CMP (CANCER CENTER ONLY)
ALT: 13 U/L (ref 0–44)
AST: 21 U/L (ref 15–41)
Albumin: 4.4 g/dL (ref 3.5–5.0)
Alkaline Phosphatase: 80 U/L (ref 38–126)
Anion gap: 5 (ref 5–15)
BUN: 10 mg/dL (ref 6–20)
CO2: 27 mmol/L (ref 22–32)
Calcium: 10.4 mg/dL — ABNORMAL HIGH (ref 8.9–10.3)
Chloride: 108 mmol/L (ref 98–111)
Creatinine: 0.96 mg/dL (ref 0.44–1.00)
GFR, Estimated: >60 mL/min (ref >60)
Glucose, Bld: 85 mg/dL (ref 70–99)
Potassium: 3.8 mmol/L (ref 3.5–5.1)
Sodium: 140 mmol/L (ref 135–145)
Total Bilirubin: 1.3 mg/dL — ABNORMAL HIGH (ref 0.3–1.2)
Total Protein: 7.5 g/dL (ref 6.5–8.1)

## 2022-05-09 LAB — CBC WITH DIFFERENTIAL (CANCER CENTER ONLY)
Abs Immature Granulocytes: 0 10*3/uL (ref 0.00–0.07)
Basophils Absolute: 0.1 10*3/uL (ref 0.0–0.1)
Basophils Relative: 2 %
Eosinophils Absolute: 0.1 10*3/uL (ref 0.0–0.5)
Eosinophils Relative: 2 %
HCT: 37.3 % (ref 36.0–46.0)
Hemoglobin: 12.2 g/dL (ref 12.0–15.0)
Immature Granulocytes: 0 %
Lymphocytes Relative: 46 %
Lymphs Abs: 1.3 10*3/uL (ref 0.7–4.0)
MCH: 28.8 pg (ref 26.0–34.0)
MCHC: 32.7 g/dL (ref 30.0–36.0)
MCV: 88 fL (ref 80.0–100.0)
Monocytes Absolute: 0.3 10*3/uL (ref 0.1–1.0)
Monocytes Relative: 11 %
Neutro Abs: 1.1 10*3/uL — ABNORMAL LOW (ref 1.7–7.7)
Neutrophils Relative %: 39 %
Platelet Count: 242 10*3/uL (ref 150–400)
RBC: 4.24 MIL/uL (ref 3.87–5.11)
RDW: 12.5 % (ref 11.5–15.5)
WBC Count: 2.9 10*3/uL — ABNORMAL LOW (ref 4.0–10.5)
nRBC: 0 % (ref 0.0–0.2)

## 2022-05-09 LAB — LACTATE DEHYDROGENASE: LDH: 144 U/L (ref 98–192)

## 2022-05-09 NOTE — Progress Notes (Signed)
[place] Telephone:(336) [phone]   Fax:([phone]) [place] NOTE  Patient Care Team: Minette Brine, FNP as PCP - General (General Practice)  Hematological/Oncological History # Elevated [facility] Ratio # Elevated Ca Levels  03/27/2022: Cr 1.02, Ca 10.7, Hgb 11.9, Kappa 37.6, Lambda 16.2, ratio 2.32. SPEP showed no M spike.  05/09/2022: establish care with Dr. Lorenso Courier   CHIEF COMPLAINTS/PURPOSE OF CONSULTATION:  "Elevated SFLC Ratio "  HISTORY OF PRESENTING ILLNESS:  Nicole Hunt 61 y.o. female with medical history significant for CKD stage III, GERD, hyperlipidemia, hypertension, and sleep apnea who presents for evaluation of elevated serum free light chain ratio and hypercalcemia.  On review of the previous records Ms. Geisel had labs drawn on 03/27/2022 which showed a calcium of 10.7, creatinine 1.02, hemoglobin 11.9, with serum free light chain showing kappa 37.6, lambda 16.2 and a ratio of 2.32.  SPEP showed no M spike.  Due to concern for these findings the patient was referred to hematology for further evaluation and management.  On exam today Ms. Strieter reports that she has been aware of elevated calcium levels since she was in the [facility].  She was also diagnosed with a G6PD deficiency.  She reports that she has otherwise been quite healthy with some hypertension, CKD, gastric ulcers and shoulder injury for which she uses Voltaren.  She is otherwise had no recent changes in her health.  Her energy levels are good and her appetite has been strong.  She reports that her family history is remarkable for breast cancer in her mother and father passed away of a stroke.  She has 3 maternal aunts with breast cancer and a cousin with breast cancer.  She does not have any children.  She reports that she is a never smoker never drinker and is retired from the [employer].  She reports that her energy levels are good and that her quality of life is  excellent.  She goes to the gym on a regular basis.  She otherwise denies any fevers, chills, sweats, nausea, vomiting or diarrhea.  A full 10 point ROS is listed below.  MEDICAL HISTORY:  Past Medical History:  Diagnosis Date   Anemia    Arthritis    Chronic kidney disease    3A   GERD (gastroesophageal reflux disease)    Heart murmur    pt. states beign   HLD (hyperlipidemia)    Hypertension    Kidney cysts    Sleep apnea    cpap    SURGICAL HISTORY: Past Surgical History:  Procedure Laterality Date   BIOPSY  03/06/2022   Procedure: BIOPSY;  Surgeon: Irving Copas., MD;  Location: [place];  Service: Gastroenterology;;   ESOPHAGOGASTRODUODENOSCOPY N/A 03/06/2022   Procedure: ESOPHAGOGASTRODUODENOSCOPY (EGD);  Surgeon: Irving Copas., MD;  Location: [place];  Service: Gastroenterology;  Laterality: N/A;   ORIF ANKLE FRACTURE Right 10/12/2021   Procedure: OPEN REDUCTION INTERNAL FIXATION (ORIF) ANKLE FRACTURE WITH SYNDESMOSIS REPAIR;  Surgeon: Armond Hang, MD;  Location: [employer];  Service: Orthopedics;  Laterality: Right;   PARTIAL HYSTERECTOMY     UPPER ESOPHAGEAL ENDOSCOPIC ULTRASOUND (EUS) N/A 03/06/2022   Procedure: UPPER ESOPHAGEAL ENDOSCOPIC ULTRASOUND (EUS);  Surgeon: Irving Copas., MD;  Location: [place];  Service: Gastroenterology;  Laterality: N/A;    SOCIAL HISTORY: Social History   Socioeconomic History   Marital status: Divorced    Spouse name: Not on file   Number of children: 0   Years of  education: Not on file   Highest education level: Not on file  Occupational History   Occupation: retired  Tobacco Use   Smoking status: Never   Smokeless tobacco: Never  Vaping Use   Vaping Use: Never used  Substance and Sexual Activity   Alcohol use: Never   Drug use: Never   Sexual activity: Not on file  Other Topics Concern   Not on file  Social History Narrative   Not on file   Social Determinants of Health    Financial Resource Strain: Not on file  Food Insecurity: Not on file  Transportation Needs: Not on file  Physical Activity: Not on file  Stress: Not on file  Social Connections: Not on file  Intimate Partner Violence: Not on file    FAMILY HISTORY: Family History  Problem Relation Age of Onset   Breast cancer Mother    Hypothyroidism Mother    Arthritis Mother    Stroke Father    Hypertension Father    Kidney disease Father    Hypertension Sister    Hypertension Brother    Healthy Brother    Congestive Heart Failure Maternal Grandmother    Colon cancer Neg Hx    Esophageal cancer Neg Hx    Inflammatory bowel disease Neg Hx    Liver disease Neg Hx    Pancreatic cancer Neg Hx    Rectal cancer Neg Hx    Stomach cancer Neg Hx     ALLERGIES:  has No Known Allergies.  MEDICATIONS:  Current Outpatient Medications  Medication Sig Dispense Refill   amLODipine (NORVASC) 5 MG tablet Take 1 tablet (5 mg total) by mouth daily. 90 tablet 0   diclofenac Sodium (VOLTAREN) 1 % GEL Apply 2 g topically 4 (four) times daily. 100 g 2   losartan (COZAAR) 25 MG tablet Take 25 mg by mouth daily.     Magnesium 250 MG TABS Take 1 tablet (250 mg total) by mouth every evening. Take with evening meal. 90 tablet 1   omeprazole (PRILOSEC) 40 MG capsule Take 1 capsule (40 mg total) by mouth 2 (two) times daily before a meal. 60 capsule 12   No current facility-administered medications for this visit.    REVIEW OF SYSTEMS:   Constitutional: ( - ) fevers, ( - )  chills , ( - ) night sweats Eyes: ( - ) blurriness of vision, ( - ) double vision, ( - ) watery eyes Ears, nose, mouth, throat, and face: ( - ) mucositis, ( - ) sore throat Respiratory: ( - ) cough, ( - ) dyspnea, ( - ) wheezes Cardiovascular: ( - ) palpitation, ( - ) chest discomfort, ( - ) lower extremity swelling Gastrointestinal:  ( - ) nausea, ( - ) heartburn, ( - ) change in bowel habits Skin: ( - ) abnormal skin  rashes Lymphatics: ( - ) new lymphadenopathy, ( - ) easy bruising Neurological: ( - ) numbness, ( - ) tingling, ( - ) new weaknesses Behavioral/Psych: ( - ) mood change, ( - ) new changes  All other systems were reviewed with the patient and are negative.  PHYSICAL EXAMINATION:  Vitals:   05/09/22 0903  BP: (!) 154/80  Pulse: (!) 56  Resp: 18  Temp: 97.6 F (36.4 C)  SpO2: 100%   Filed Weights   05/09/22 0903  Weight: 181 lb 12.8 oz (82.5 kg)    GENERAL: well appearing middle aged African-American female in NAD  SKIN: skin color, texture,  turgor are normal, no rashes or significant lesions EYES: conjunctiva are pink and non-injected, sclera clear LUNGS: clear to auscultation and percussion with normal breathing effort HEART: regular rate & rhythm and no murmurs and no lower extremity edema Musculoskeletal: no cyanosis of digits and no clubbing  PSYCH: alert & oriented x 3, fluent speech NEURO: no focal motor/sensory deficits  LABORATORY DATA:  I have reviewed the data as listed    Latest Ref Rng & Units 05/09/2022   10:05 AM 10/11/2021    9:30 PM 08/15/2020   10:33 AM  CBC  WBC 4.0 - 10.5 K/uL 2.9  7.3  4.0   Hemoglobin 12.0 - 15.0 g/dL 12.2  11.8  13.6   Hematocrit 36.0 - 46.0 % 37.3  37.4  40.5   Platelets 150 - 400 K/uL 242  255  287        Latest Ref Rng & Units 05/09/2022   10:05 AM 10/11/2021    9:30 PM 08/19/2021    9:15 AM  CMP  Glucose 70 - 99 mg/dL 85  103  95   BUN 6 - 20 mg/dL _0 Creatinine 0.44 - 1.00 mg/dL 0.96  1.08  1.17   Sodium 135 - 145 mmol/L 140  135  140   Potassium 3.5 - 5.1 mmol/L 3.8  4.1  4.2   Chloride 98 - 111 mmol/L 108  107  106   CO2 22 - 32 mmol/L _1 Calcium 8.9 - 10.3 mg/dL 10.4  9.7  10.2   Total Protein 6.5 - 8.1 g/dL 7.5   7.6   Total Bilirubin 0.3 - 1.2 mg/dL 1.3   1.3   Alkaline Phos 38 - 126 U/L 80   82   AST 15 - 41 U/L 21   27   ALT 0 - 44 U/L 13   18      ASSESSMENT & PLAN Nicole Hunt 61  y.o. female with medical history significant for CKD stage III, GERD, hyperlipidemia, hypertension, and sleep apnea who presents for evaluation of elevated serum free light chain ratio and hypercalcemia.  After review of the labs, review of the records, and discussion with the patient the patients findings are most consistent with elevated serum free light chain ratio elevated calcium in the setting of CKD.  Today we will perform full work-up to order to rule out monoclonal gammopathy.  It is reassuring that on her initial SPEP she did not have an M protein.  We will conduct a full work-up below.  If there are no abnormalities noted we do not require routine follow-up in our clinic.  #Elevated SFLC Ratio/ Elevated Calcium --today will order an SPEP, UPEP, SFLC and beta 2 microglobulin --additionally will collect new baseline CBC, CMP, and LDH --recommend a metastatic bone survey to assess for lytic lesions --will consider the need for a bone marrow biopsy pending the above results --RTC pending results of the above studies.   #Strong Family History of Breast Cancer -- Patient's mother and 3 maternal aunts all have breast cancer.  She also had several cousins with breast cancer.  We will make referral to genetic counseling.  Orders Placed This Encounter  Procedures   DG Bone Survey Met    Standing Status:   Future    Number of Occurrences:   1    Standing Expiration Date:   05/10/2023    Order Specific Question:   Reason for  Exam (SYMPTOM  OR DIAGNOSIS REQUIRED)    Answer:   elevated Calcium and MGUS, assess for lytic lesions    Order Specific Question:   Is patient pregnant?    Answer:   No    Order Specific Question:   Preferred imaging location?    Answer:   [facility]   CBC with Differential (Cancer Center Only)    Standing Status:   Future    Number of Occurrences:   1    Standing Expiration Date:   05/10/2023   CMP ([place] only)    Standing Status:   Future     Number of Occurrences:   1    Standing Expiration Date:   05/10/2023   Lactate dehydrogenase (LDH)    Standing Status:   Future    Number of Occurrences:   1    Standing Expiration Date:   05/09/2023   Multiple Myeloma Panel (SPEP&IFE w/QIG)    Standing Status:   Future    Number of Occurrences:   1    Standing Expiration Date:   05/09/2023   Kappa/lambda light chains    Standing Status:   Future    Number of Occurrences:   1    Standing Expiration Date:   05/09/2023   24-Hr Ur UPEP/UIFE/Light Chains/TP    Standing Status:   Future    Number of Occurrences:   1    Standing Expiration Date:   05/09/2023   Ambulatory referral to Genetics    Referral Priority:   Routine    Referral Type:   Consultation    Referral Reason:   Specialty Services Required    Number of Visits Requested:   1   All questions were answered. The patient knows to call the clinic with any problems, questions or concerns.  A total of more than 60 minutes were spent on this encounter with face-to-face time and non-face-to-face time, including preparing to see the patient, ordering tests and/or medications, counseling the patient and coordination of care as outlined above.   Ledell Peoples, MD Department of Hematology/Oncology [place] [facility] Phone: [phone] Pager: [phone] Email: [email or web address]  05/18/2022 7:37 PM

## 2022-05-12 ENCOUNTER — Other Ambulatory Visit: Payer: Self-pay

## 2022-05-12 ENCOUNTER — Encounter: Payer: Self-pay | Admitting: Nurse Practitioner

## 2022-05-12 LAB — KAPPA/LAMBDA LIGHT CHAINS
Kappa free light chain: 35 mg/L — ABNORMAL HIGH (ref 3.3–19.4)
Kappa, lambda light chain ratio: 1.86 — ABNORMAL HIGH (ref 0.26–1.65)
Lambda free light chains: 18.8 mg/L (ref 5.7–26.3)

## 2022-05-12 MED ORDER — AMLODIPINE BESYLATE 5 MG PO TABS: 5.0000 mg | ORAL_TABLET | Freq: Every day | ORAL | 0 refills | Status: DC

## 2022-05-13 LAB — MULTIPLE MYELOMA PANEL, SERUM
Albumin SerPl Elph-Mcnc: 3.8 g/dL (ref 2.9–4.4)
Albumin/Glob SerPl: 1.2 (ref 0.7–1.7)
Alpha 1: 0.2 g/dL (ref 0.0–0.4)
Alpha2 Glob SerPl Elph-Mcnc: 0.7 g/dL (ref 0.4–1.0)
B-Globulin SerPl Elph-Mcnc: 1 g/dL (ref 0.7–1.3)
Gamma Glob SerPl Elph-Mcnc: 1.5 g/dL (ref 0.4–1.8)
Globulin, Total: 3.4 g/dL (ref 2.2–3.9)
IgA: 303 mg/dL (ref 87–352)
IgG (Immunoglobin G), Serum: 1519 mg/dL (ref 586–1602)
IgM (Immunoglobulin M), Srm: 59 mg/dL (ref 26–217)
Total Protein ELP: 7.2 g/dL (ref 6.0–8.5)

## 2022-05-16 ENCOUNTER — Ambulatory Visit (HOSPITAL_COMMUNITY)
Admission: RE | Admit: 2022-05-16 | Discharge: 2022-05-16 | Disposition: A | Discharge status: Home / Self Care | Source: Ambulatory Visit | Attending: Hematology and Oncology | Admitting: Hematology and Oncology

## 2022-05-16 ENCOUNTER — Other Ambulatory Visit: Payer: Self-pay

## 2022-05-16 DIAGNOSIS — R768 Other specified abnormal immunological findings in serum: Secondary | ICD-10-CM | POA: Insufficient documentation

## 2022-05-20 LAB — UPEP/UIFE/LIGHT CHAINS/TP, 24-HR UR
% BETA, Urine: 35 %
ALPHA 1 URINE: 3.5 %
Albumin, U: 39.2 %
Alpha 2, Urine: 13.4 %
Free Kappa Lt Chains,Ur: 10.69 mg/L (ref 1.17–86.46)
Free Kappa/Lambda Ratio: >15.49 — ABNORMAL HIGH (ref 1.83–14.26)
Free Lambda Lt Chains,Ur: <0.69 mg/L (ref 0.27–15.21)
GAMMA GLOBULIN URINE: 9 %
Total Protein, Urine-Ur/day: 234 mg/24 hr — ABNORMAL HIGH (ref 30–150)
Total Protein, Urine: 13 mg/dL
Total Volume: 1800

## 2022-06-02 ENCOUNTER — Encounter

## 2022-06-02 DIAGNOSIS — Z1231 Encounter for screening mammogram for malignant neoplasm of breast: Secondary | ICD-10-CM

## 2022-06-04 ENCOUNTER — Telehealth: Payer: Self-pay

## 2022-06-04 NOTE — Telephone Encounter (Addendum)
Spoke with pt to advise of message below. Patient appreciative for information and understands that there is no need for routine follow-up care with Dr. Lorenso Courier. ----- Message ----- From: Orson Slick, MD Sent: 06/01/2022   9:03 AM EDT To: Otila Kluver, RN Subject: P2                                             Please let Nicole Hunt know that we did not find any concerning abnormal protein in her blood. At this time her findings are most likely due to her kidney dysfunction. There is no evidence of MGUS. There is no need for routine f/u.  ----- Message ----- From: Interface, Lab In [place] Sent: 05/09/2022  10:12 AM EDT To: Orson Slick, MD

## 2022-06-13 ENCOUNTER — Ambulatory Visit
Admission: RE | Admit: 2022-06-13 | Discharge: 2022-06-13 | Disposition: A | Discharge status: Home / Self Care | Source: Ambulatory Visit | Attending: Nurse Practitioner | Admitting: Nurse Practitioner

## 2022-06-13 DIAGNOSIS — Z1231 Encounter for screening mammogram for malignant neoplasm of breast: Secondary | ICD-10-CM

## 2022-07-21 ENCOUNTER — Other Ambulatory Visit: Payer: Self-pay | Admitting: Genetic Counselor

## 2022-07-21 ENCOUNTER — Other Ambulatory Visit: Payer: Self-pay

## 2022-07-21 ENCOUNTER — Inpatient Hospital Stay

## 2022-07-21 ENCOUNTER — Inpatient Hospital Stay: Attending: Hematology | Admitting: Genetic Counselor

## 2022-07-21 DIAGNOSIS — Z803 Family history of malignant neoplasm of breast: Secondary | ICD-10-CM

## 2022-07-21 DIAGNOSIS — Z800 Family history of malignant neoplasm of digestive organs: Secondary | ICD-10-CM

## 2022-07-21 LAB — GENETIC SCREENING ORDER

## 2022-07-21 NOTE — Progress Notes (Signed)
REFERRING PROVIDER: Orson Slick, MD [facility] [place],  [place] [id number]  PRIMARY PROVIDER:  Minette Brine, FNP  PRIMARY REASON FOR VISIT:  Encounter Diagnoses  Name Primary?   Family history of breast cancer Yes   Family history of colon cancer     HISTORY OF PRESENT ILLNESS:   Nicole Hunt, a 61 y.o. female, was seen for a [facility] genetics consultation at the request of Dr. Lorenso Courier due to a family history of breast cancer.  Nicole Hunt presents to clinic today to discuss the possibility of a hereditary predisposition to cancer, to discuss genetic testing, and to further clarify her future cancer risks, as well as potential cancer risks for family members.   Nicole Hunt is a 61 y.o. female with no personal history of cancer.    CANCER HISTORY:  Oncology History   No history exists.     RISK FACTORS:  Mammogram within the last year: 05/2022; category c breast density  Number of breast biopsies: 0. Colonoscopy: yes;  several years ago . Hysterectomy: yes before age 83; fibroids Ovaries intact: yes. Menarche was at age 32.  Nulliparous.  OCP use for approximately 0 years.  HRT use: 0 years. Dermatology screening: not currently   Past Medical History:  Diagnosis Date   Anemia    Arthritis    Chronic kidney disease    3A   GERD (gastroesophageal reflux disease)    Heart murmur    pt. states beign   HLD (hyperlipidemia)    Hypertension    Kidney cysts    Sleep apnea    cpap    Past Surgical History:  Procedure Laterality Date   BIOPSY  03/06/2022   Procedure: BIOPSY;  Surgeon: Irving Copas., MD;  Location: [facility] ENDOSCOPY;  Service: Gastroenterology;;   ESOPHAGOGASTRODUODENOSCOPY N/A 03/06/2022   Procedure: ESOPHAGOGASTRODUODENOSCOPY (EGD);  Surgeon: Irving Copas., MD;  Location: [facility];  Service: Gastroenterology;  Laterality: N/A;   ORIF ANKLE FRACTURE Right 10/12/2021   Procedure: OPEN REDUCTION INTERNAL  FIXATION (ORIF) ANKLE FRACTURE WITH SYNDESMOSIS REPAIR;  Surgeon: Armond Hang, MD;  Location: [facility];  Service: Orthopedics;  Laterality: Right;   PARTIAL HYSTERECTOMY     UPPER ESOPHAGEAL ENDOSCOPIC ULTRASOUND (EUS) N/A 03/06/2022   Procedure: UPPER ESOPHAGEAL ENDOSCOPIC ULTRASOUND (EUS);  Surgeon: Irving Copas., MD;  Location: [facility];  Service: Gastroenterology;  Laterality: N/A;    FAMILY HISTORY:  We obtained a detailed, 4-generation family history.  Significant diagnoses are listed below: Family History  Problem Relation Age of Onset   Breast cancer Mother 53   Breast cancer Maternal Aunt        x2 mat aunts   Cancer Maternal Uncle        mouth cancer; d. > 50   Colon cancer Maternal Uncle        d. > 60   Breast cancer Cousin        maternal female cousin; dx > 5   Breast cancer Cousin        maternal female cousin; dx > 63   Colon cancer Cousin        maternal female cousin; dx > 4       Nicole Hunt's sister previously had negative genetic testing.  Affected relatives, including her mother, are unavailable for genetic testing at this time.    There is no reported Ashkenazi Jewish ancestry. There is no known consanguinity.  GENETIC COUNSELING ASSESSMENT: Nicole Hunt is a 62  y.o. female with a family history which is somewhat suggestive of a hereditary cancer syndrome and predisposition to cancer given the presence of multiple relatives with breast and colon cancer. We, therefore, discussed and recommended the following at today's visit.   DISCUSSION: We discussed that 5 - 10% of cancer is hereditary, with most cases of hereditary breast cancer associated with mutations in BRCA1/2.  There are other genes that can be associated with hereditary breast cancer syndromes.  We discussed that testing is beneficial for several reasons, including knowing about other cancer risks, identifying potential screening and risk-reduction options that may be  appropriate, and to understanding if other family members could be at risk for cancer and allowing them to undergo genetic testing.  We reviewed the characteristics, features and inheritance patterns of hereditary cancer syndromes. We also discussed genetic testing, including the appropriate family members to test, the process of testing, insurance coverage and turn-around-time for results. We discussed the implications of a negative, positive, and/or variant of uncertain significant result. We discussed that negative results would be uninformative given that Nicole Hunt does not have a personal history of cancer. We recommended [name] pursue genetic testing for a panel that contains genes associated with breast and colon cancer.  Nicole Hunt was offered a common hereditary cancer panel (~40 genes) and an expanded pan-cancer panel (~70 genes). Nicole Hunt was informed of the benefits and limitations of each panel, including that expanded pan-cancer panels contain several genes that do not have clear management guidelines at this point in time.  We also discussed that as the number of genes included on a panel increases, the chances of variants of uncertain significance increases.  After considering the benefits and limitations of each gene panel, Nicole Hunt elected to have a common hereditary cancers panel through [facility].   The Common Hereditary Cancers + RNA Panel offered by [facility] includes sequencing, deletion/duplication, and RNA testing of the following 47 genes: APC, ATM, AXIN2, BARD1, BMPR1A, BRCA1, BRCA2, BRIP1, CDH1, CDK4*, CDKN2A (p14ARF)*, CDKN2A (p16INK4a)*, CHEK2, CTNNA1, DICER1, EPCAM (Deletion/duplication testing only), GREM1 (promoter region deletion/duplication testing only), KIT, MEN1, MLH1, MSH2, MSH3, MSH6, MUTYH, NBN, NF1, NHTL1, PALB2, PDGFRA*, PMS2, POLD1, POLE, PTEN, RAD50, RAD51C, RAD51D, SDHB, SDHC, SDHD, SMAD4, SMARCA4. STK11, TP53, TSC1, TSC2, and VHL.  The  following genes were evaluated for sequence changes only: SDHA and HOXB13 c.251G>A variant only.  RNA analysis is not performed for the * genes.     Based on [name]'s family history of breast cancer, she meets medical criteria for genetic testing. Despite that she meets criteria, she may still have an out of pocket cost. We discussed that if her out of pocket cost for testing is over $100, the laboratory should contact them to discuss self-pay options and/or patient pay assistance programs.   We discussed that some people do not want to undergo genetic testing due to fear of genetic discrimination.  A federal law called the Genetic Information Non-Discrimination Act (GINA) of 2008 helps protect individuals against genetic discrimination based on their genetic test results.  It impacts both health insurance and employment.  With health insurance, it protects against increased premiums, being kicked off insurance or being forced to take a test in order to be insured.  For employment it protects against hiring, firing and promoting decisions based on genetic test results.  GINA does not apply to those in the [employer], those who work for companies with less than 15 employees, and new life insurance or long-term  disability Engineer, structural.  Health status due to a cancer diagnosis is not protected under GINA.  PLAN: After considering the risks, benefits, and limitations, Ms. Goffredo provided informed consent to pursue genetic testing and the blood sample was sent to [facility] for analysis of the Common Hereditary Cancers +RNA Panel. Results should be available within approximately 3 weeks' time, at which point they will be disclosed by telephone to Ms. Speckman, as will any additional recommendations warranted by these results. Ms. Meland will receive a summary of her genetic counseling visit and a copy of her results once available. This information will also be available in Epic.     Ms. [place] questions were answered to her satisfaction today. Our contact information was provided should additional questions or concerns arise. Thank you for the referral and allowing [place] to share in the care of your patient.   Minah Axelrod M. Joette Catching, [place], [facility] Genetic Counselor [email or web address] (P) [phone]   The patient was seen for a total of 30 minutes in face-to-face genetic counseling.  The patient was seen alone.  Drs. Lindi Adie and/or Burr Medico were available to discuss this case as needed.  _______________________________________________________________________ For Office Staff:  Number of people involved in session: 1 Was an Intern/ student involved with case: no

## 2022-07-23 ENCOUNTER — Encounter: Payer: Self-pay | Admitting: Genetic Counselor

## 2022-08-04 ENCOUNTER — Other Ambulatory Visit: Payer: Self-pay | Admitting: Nurse Practitioner

## 2022-08-12 ENCOUNTER — Encounter: Payer: Self-pay | Admitting: Genetic Counselor

## 2022-08-12 ENCOUNTER — Telehealth: Payer: Self-pay | Admitting: Genetic Counselor

## 2022-08-12 DIAGNOSIS — Z1379 Encounter for other screening for genetic and chromosomal anomalies: Secondary | ICD-10-CM | POA: Insufficient documentation

## 2022-08-12 NOTE — Telephone Encounter (Signed)
Revealed negative genetics.  TC score over 20%.  Discussed high risk breast screening.  Declined referral to [facility] breast clinic at this time.

## 2022-08-13 ENCOUNTER — Ambulatory Visit: Payer: Self-pay | Admitting: Genetic Counselor

## 2022-08-13 DIAGNOSIS — Z803 Family history of malignant neoplasm of breast: Secondary | ICD-10-CM

## 2022-08-13 DIAGNOSIS — Z800 Family history of malignant neoplasm of digestive organs: Secondary | ICD-10-CM

## 2022-08-13 DIAGNOSIS — Z1379 Encounter for other screening for genetic and chromosomal anomalies: Secondary | ICD-10-CM

## 2022-08-13 NOTE — Progress Notes (Signed)
HPI:   Nicole Hunt was previously seen in the [facility] due to a family history of breast cancer and concerns regarding a hereditary predisposition to cancer. Please refer to our prior cancer genetics clinic note for more information regarding our discussion, assessment and recommendations, at the time. Nicole Hunt's recent genetic test results were disclosed to her, as were recommendations warranted by these results. These results and recommendations are discussed in more detail below.  CANCER HISTORY:  Oncology History   No history exists.    FAMILY HISTORY:  We obtained a detailed, 4-generation family history.  Significant diagnoses are listed below:      Family History  Problem Relation Age of Onset   Breast cancer Mother 58   Breast cancer Maternal Aunt          x2 mat aunts   Cancer Maternal Uncle          mouth cancer; d. > 50   Colon cancer Maternal Uncle          d. > 50   Breast cancer Cousin          maternal female cousin; dx > 50   Breast cancer Cousin          maternal female cousin; dx > 50   Colon cancer Cousin          maternal female cousin; dx > 50           Nicole Hunt's sister previously had negative genetic testing.  Affected relatives, including her mother, are unavailable for genetic testing at this time.     There is no reported Ashkenazi Jewish ancestry. There is no known consanguinity.  GENETIC TEST RESULTS:  The [facility] Common Hereditary Cancers +RNA Panel found no pathogenic mutations.    The Common Hereditary Cancers + RNA Panel offered by [facility] includes sequencing, deletion/duplication, and RNA testing of the following 48 genes: APC, ATM, AXIN2, BARD1, BMPR1A, BRCA1, BRCA2, BRIP1, CDH1, CDK4*, CDKN2A (p14ARF)*, CDKN2A (p16INK4a)*, CHEK2, CTNNA1, DICER1, EPCAM (Deletion/duplication testing only), GREM1 (promoter region deletion/duplication testing only), KIT, MBD4, MEN1, MLH1, MSH2, MSH3, MSH6, MUTYH, NBN, NF1,  NHTL1, PALB2, PDGFRA*, PMS2, POLD1, POLE, PTEN, RAD50, RAD51C, RAD51D, SDHB, SDHC, SDHD, SMAD4, SMARCA4. STK11, TP53, TSC1, TSC2, and VHL.  The following genes were evaluated for sequence changes only: SDHA and HOXB13 c.251G>A variant only.  RNA analysis is not performed for the * genes.    The test report has been scanned into EPIC and is located under the Molecular Pathology section of the Results Review tab.  A portion of the result report is included below for reference. Genetic testing reported out on August 08, 2022.       Even though a pathogenic variant was not identified, possible explanations for the cancer in the family may include: There may be no hereditary risk for cancer in the family. The cancers in Nicole Hunt's family may be sporadic/familial or due to other genetic and environmental factors. There may be a gene mutation in one of these genes that current testing methods cannot detect but that chance is small. There could be another gene that has not yet been discovered, or that we have not yet tested, that is responsible for the cancer diagnoses in the family.  It is also possible there is a hereditary cause for the cancer in the family that Nicole Hunt did not inherit.   Therefore, it is important to remain in touch with cancer genetics in the future   so that we can continue to offer Nicole Hunt the most up to date genetic testing.     ADDITIONAL GENETIC TESTING:  We discussed with Nicole Hunt that her genetic testing was fairly extensive.  If there are additional relevant genes identified to increase cancer risk that can be analyzed in the future, we would be happy to discuss and coordinate this testing at that time.     CANCER SCREENING RECOMMENDATIONS:  Nicole Hunt test result is considered negative (normal).  This means that we have not identified a hereditary cause for her family history of cancer at this time.   An individual's cancer risk and  medical management are not determined by genetic test results alone. Overall cancer risk assessment incorporates additional factors, including personal medical history, family history, and any available genetic information that may result in a personalized plan for cancer prevention and surveillance. Therefore, it is recommended she continue to follow the cancer management and screening guidelines provided by her primary healthcare provider.  Nicole Hunt has been determined to be at high risk for breast cancer.  Her lifetime risk for breast cancer based on Tyrer-Cuzick risk model is 25.5%.  This risk estimate can change over time and may be repeated to reflect new information in her personal or family history in the future.  For females with a greater than 20% lifetime risk of breast cancer, the [employer]  (NCCN) recommends the following:  Clinical encounter every 6-12 months to begin when identified as being at increased risk, but not before age 80  Annual mammograms. Tomosynthesis is recommended starting 10 years earlier than the youngest breast cancer diagnosis in the family or at age 68 (whichever comes first), but not before age 19   Annual breast MRI starting 10 years earlier than the youngest breast cancer diagnosis in the family or at age 54 (whichever comes first), but not before age 66      We, therefore, she should discuss the usefulness of an annual breast MRI with her primary care provider.  Nicole Hunt is not interested in a referral to the high risk breast clinic at this time.  She knows to reach out in the future if she is interested.   RECOMMENDATIONS FOR FAMILY MEMBERS:   Individuals in this family might be at some increased risk of developing cancer, over the general population risk, due to the family history of cancer.  Individuals in the family should notify their providers of the family history of cancer. We recommend women in this family have a  yearly mammogram beginning at age 51, or 39 years younger than the earliest onset of cancer, an annual clinical breast exam, and perform monthly breast self-exams.   Other members of the family may still carry a pathogenic variant in one of these genes that Nicole Hunt did not inherit. Based on the family history, we recommend her mother, who was diagnosed with breast cancer, have genetic counseling and testing. Nicole Hunt will let us know if we can be of any assistance in coordinating genetic counseling and/or testing for this family member.  She stated that she does not think her mother will be interested in genetic testing.    FOLLOW-UP:  Lastly, we discussed with Nicole Hunt that cancer genetics is a rapidly advancing field and it is possible that new genetic tests will be appropriate for her and/or her family members in the future. We encouraged her to remain in contact with cancer genetics on an annual basis  so we can update her personal and family histories and let her know of advances in cancer genetics that may benefit this family.   Our contact number was provided. Nicole Hunt's questions were answered to her satisfaction, and she knows she is welcome to call us at anytime with additional questions or concerns.   Cari M. Koerner, MS, LCGC Genetic Counselor [email or web address] (P) [phone]  

## 2022-08-27 ENCOUNTER — Ambulatory Visit (INDEPENDENT_AMBULATORY_CARE_PROVIDER_SITE_OTHER): Admitting: Nurse Practitioner

## 2022-08-27 ENCOUNTER — Other Ambulatory Visit (HOSPITAL_COMMUNITY)
Admission: RE | Admit: 2022-08-27 | Discharge: 2022-08-27 | Disposition: A | Discharge status: Home / Self Care | Source: Ambulatory Visit | Attending: Nurse Practitioner | Admitting: Nurse Practitioner

## 2022-08-27 ENCOUNTER — Encounter: Payer: Self-pay | Admitting: Nurse Practitioner

## 2022-08-27 VITALS — BP 128/78 | HR 79 | Temp 98.3°F | Ht 69.0 in | Wt 198.0 lb

## 2022-08-27 DIAGNOSIS — E7800 Pure hypercholesterolemia, unspecified: Secondary | ICD-10-CM

## 2022-08-27 DIAGNOSIS — I10 Essential (primary) hypertension: Secondary | ICD-10-CM

## 2022-08-27 DIAGNOSIS — Z79899 Other long term (current) drug therapy: Secondary | ICD-10-CM

## 2022-08-27 DIAGNOSIS — Z124 Encounter for screening for malignant neoplasm of cervix: Secondary | ICD-10-CM | POA: Diagnosis present

## 2022-08-27 DIAGNOSIS — Z23 Encounter for immunization: Secondary | ICD-10-CM

## 2022-08-27 DIAGNOSIS — Z0000 Encounter for general adult medical examination without abnormal findings: Secondary | ICD-10-CM | POA: Diagnosis not present

## 2022-08-27 DIAGNOSIS — N289 Disorder of kidney and ureter, unspecified: Secondary | ICD-10-CM

## 2022-08-27 DIAGNOSIS — E559 Vitamin D deficiency, unspecified: Secondary | ICD-10-CM | POA: Diagnosis not present

## 2022-08-27 NOTE — Patient Instructions (Signed)

## 2022-08-27 NOTE — Progress Notes (Signed)
I,Tianna Badgett,acting as a Education administrator for [employer], FNP.,have documented all relevant documentation on the behalf of Minette Brine, FNP,as directed by  Minette Brine, FNP while in the presence of Minette Brine, [place].   Subjective:     Patient ID: Nicole Hunt , female    DOB: Mar 01, 1961 , 61 y.o.   MRN: [id number]   Chief Complaint  Patient presents with   Annual Exam    HPI  Patient is here for a physical exam. She does not have a GYN. Her last PAP was in June 2020, she has no concerns today.  She is being followed by GI and in the notes she is due for her colonoscopy in 2027, I will request records from the [place]  Hypertension This is a chronic problem. The current episode started more than 1 month ago. The problem has been gradually worsening since onset. The problem is uncontrolled. Pertinent negatives include no chest pain, headaches, palpitations or shortness of breath. Risk factors for coronary artery disease include sedentary lifestyle. Past treatments include diuretics. The current treatment provides mild improvement. Compliance problems include diet and exercise.      Past Medical History:  Diagnosis Date   Anemia    Arthritis    Chronic kidney disease    3A   GERD (gastroesophageal reflux disease)    Heart murmur    pt. states beign   HLD (hyperlipidemia)    Hypertension    Kidney cysts    Sleep apnea    cpap     Family History  Problem Relation Age of Onset   Breast cancer Mother 40   Hypothyroidism Mother    Arthritis Mother    Stroke Father    Hypertension Father    Kidney disease Father    Hypertension Sister    Hypertension Brother    Healthy Brother    Breast cancer Maternal Aunt        x2 mat aunts   Cancer Maternal Uncle        mouth cancer; d. > 64   Colon cancer Maternal Uncle        d. > 50   Congestive Heart Failure Maternal Grandmother    Breast cancer Cousin        maternal female cousin; dx > 74   Breast cancer Cousin         maternal female cousin; dx > 52   Colon cancer Cousin        maternal female cousin; dx > 4   Esophageal cancer Neg Hx    Inflammatory bowel disease Neg Hx    Liver disease Neg Hx    Pancreatic cancer Neg Hx    Rectal cancer Neg Hx    Stomach cancer Neg Hx      Current Outpatient Medications:    amLODipine (NORVASC) 5 MG tablet, TAKE 1 TABLET(5 MG) BY MOUTH DAILY, Disp: 90 tablet, Rfl: 0   diclofenac Sodium (VOLTAREN) 1 % GEL, Apply 2 g topically 4 (four) times daily., Disp: 100 g, Rfl: 2   losartan (COZAAR) 25 MG tablet, Take 25 mg by mouth daily., Disp: , Rfl:    Magnesium 250 MG TABS, Take 1 tablet (250 mg total) by mouth every evening. Take with evening meal., Disp: 90 tablet, Rfl: 1   omeprazole (PRILOSEC) 40 MG capsule, Take 1 capsule (40 mg total) by mouth 2 (two) times daily before a meal., Disp: 60 capsule, Rfl: 12   Vitamin D, Ergocalciferol, (DRISDOL) 1.25 MG (50000 UNIT)  CAPS capsule, Take 1 capsule (50,000 Units total) by mouth every 7 (seven) days., Disp: 12 capsule, Rfl: 1   No Known Allergies    The patient states she uses status post hysterectomy partial she thinks her cervix is still present.  No LMP recorded. Patient has had a hysterectomy.. Negative for Dysmenorrhea and Negative for Menorrhagia. Negative for: breast discharge, breast lump(s), breast pain and breast self exam. Associated symptoms include abnormal vaginal bleeding. Pertinent negatives include abnormal bleeding (hematology), anxiety, decreased libido, depression, difficulty falling sleep, dyspareunia, history of infertility, nocturia, sexual dysfunction, sleep disturbances, urinary incontinence, urinary urgency, vaginal discharge and vaginal itching. Diet regular goal to eat plant protein.  The patient states her exercise level is moderate - vigourous with weight lifting.   The patient's tobacco use is:  Social History   Tobacco Use  Smoking Status Never  Smokeless Tobacco Never  She has been exposed  to passive smoke. The patient's alcohol use is:  Social History   Substance and Sexual Activity  Alcohol Use Never   Additional information: Last pap 2020, next one scheduled for today, she reports having a hysterectomy but still has her cervix per the patient.    Review of Systems  Constitutional: Negative.   HENT: Negative.    Eyes: Negative.   Respiratory: Negative.  Negative for shortness of breath.   Cardiovascular: Negative.  Negative for chest pain and palpitations.  Gastrointestinal: Negative.   Endocrine: Negative.   Genitourinary: Negative.   Musculoskeletal: Negative.   Skin: Negative.   Allergic/Immunologic: Negative.   Neurological: Negative.  Negative for headaches.  Hematological: Negative.   Psychiatric/Behavioral: Negative.       Today's Vitals   08/27/22 0942  BP: 128/78  Pulse: 79  Temp: 98.3 F (36.8 C)  TempSrc: Oral  Weight: 198 lb (89.8 kg)  Height: _0  (1.753 m)   Body mass index is 29.24 kg/m.  Wt Readings from Last 3 Encounters:  08/27/22 198 lb (89.8 kg)  05/09/22 181 lb 12.8 oz (82.5 kg)  03/20/22 174 lb 9.6 oz (79.2 kg)    Objective:  Physical Exam Vitals reviewed.  Constitutional:      General: She is not in acute distress.    Appearance: Normal appearance. She is well-developed. She is obese.  HENT:     Head: Normocephalic and atraumatic.     Right Ear: Hearing, tympanic membrane, ear canal and external ear normal. There is no impacted cerumen.     Left Ear: Hearing, tympanic membrane, ear canal and external ear normal. There is no impacted cerumen.     Nose:     Comments: Deferred - masked    Mouth/Throat:     Comments: Deferred - masked Eyes:     General: Lids are normal.     Extraocular Movements: Extraocular movements intact.     Conjunctiva/sclera: Conjunctivae normal.     Pupils: Pupils are equal, round, and reactive to light.     Funduscopic exam:    Right eye: No papilledema.        Left eye: No papilledema.   Neck:     Thyroid: No thyroid mass.     Vascular: No carotid bruit.  Cardiovascular:     Rate and Rhythm: Normal rate and regular rhythm.     Pulses: Normal pulses.     Heart sounds: Normal heart sounds. No murmur heard. Pulmonary:     Effort: Pulmonary effort is normal.     Breath sounds: Normal breath sounds.  Chest:     Chest wall: No mass.  Breasts:    Tanner Score is 5.     Right: Normal. No mass or tenderness.     Left: Normal. No mass or tenderness.  Abdominal:     General: Abdomen is flat. Bowel sounds are normal. There is no distension.     Palpations: Abdomen is soft.     Tenderness: There is no abdominal tenderness.  Genitourinary:    General: Normal vulva.     Tanner stage (genital): 5.     Labia:        Right: No rash.        Left: No rash.      Vagina: Normal.     Cervix: Normal.     Uterus: Normal.      Adnexa: Right adnexa normal and left adnexa normal.     Rectum: Normal. Guaiac result negative.  Musculoskeletal:        General: No swelling. Normal range of motion.     Cervical back: Full passive range of motion without pain, normal range of motion and neck supple.     Right lower leg: No edema.     Left lower leg: No edema.  Lymphadenopathy:     Upper Body:     Right upper body: No supraclavicular, axillary or pectoral adenopathy.     Left upper body: No supraclavicular, axillary or pectoral adenopathy.  Skin:    General: Skin is warm and dry.     Capillary Refill: Capillary refill takes less than 2 seconds.  Neurological:     General: No focal deficit present.     Mental Status: She is alert and oriented to person, place, and time.     Cranial Nerves: No cranial nerve deficit.     Sensory: No sensory deficit.  Psychiatric:        Mood and Affect: Mood normal.        Behavior: Behavior normal.        Thought Content: Thought content normal.        Judgment: Judgment normal.         Assessment And Plan:     1. Encounter for general adult  medical examination w/o abnormal findings Behavior modifications discussed and diet history reviewed.   Pt will continue to exercise regularly and modify diet with low GI, plant based foods and decrease intake of processed foods.  Recommend intake of daily multivitamin, Vitamin D, and calcium.  Recommend mammogram and colonoscopy for preventive screenings, as well as recommend immunizations that include influenza, TDAP, and Shingles  2. Encounter for Papanicolaou smear of cervix Comments: PAP done with no abnormal physical findings - Cytology -Pap Smear  3. Essential hypertension Comments: Blood pressure is well controlled. Continue current medications - CMP14+EGFR  4. Vitamin D deficiency Will check vitamin D level and supplement as needed.    Also encouraged to spend 15 minutes in the sun daily.  - VITAMIN D 25 Hydroxy (Vit-D Deficiency, Fractures)  5. Elevated LDL cholesterol level Cholesterol levels were improved at last visit. Continue low fat diet - Lipid panel  6. Function kidney decreased Comments: Continue f/u with Nephrology - POCT Urinalysis Dipstick (81002) - Microalbumin / Creatinine Urine Ratio  7. Need for influenza vaccination Influenza vaccine administered Encouraged to take Tylenol as needed for fever or muscle aches. - Flu Vaccine QUAD 6+ mos PF IM (Fluarix Quad PF)  8. Other long term (current) drug therapy - CBC  Patient was given opportunity to ask questions. Patient verbalized understanding of the plan and was able to repeat key elements of the plan. All questions were answered to their satisfaction.   Minette Brine, FNP    I, Minette Brine, FNP, have reviewed all documentation for this visit. The documentation on 08/27/22 for the exam, diagnosis, procedures, and orders are all accurate and complete.  THE PATIENT IS ENCOURAGED TO PRACTICE SOCIAL DISTANCING DUE TO THE COVID-19 PANDEMIC.

## 2022-08-28 LAB — CMP14+EGFR
ALT: 16 IU/L (ref 0–32)
AST: 26 IU/L (ref 0–40)
Albumin/Globulin Ratio: 1.4 (ref 1.2–2.2)
Albumin: 4.5 g/dL (ref 3.8–4.9)
Alkaline Phosphatase: 104 IU/L (ref 44–121)
BUN/Creatinine Ratio: 13 (ref 9–23)
BUN: 15 mg/dL (ref 6–24)
Bilirubin Total: 1.2 mg/dL (ref 0.0–1.2)
CO2: 21 mmol/L (ref 20–29)
Calcium: 10.4 mg/dL — ABNORMAL HIGH (ref 8.7–10.2)
Chloride: 103 mmol/L (ref 96–106)
Creatinine, Ser: 1.14 mg/dL — ABNORMAL HIGH (ref 0.57–1.00)
Globulin, Total: 3.2 g/dL (ref 1.5–4.5)
Glucose: 89 mg/dL (ref 70–99)
Potassium: 3.7 mmol/L (ref 3.5–5.2)
Sodium: 140 mmol/L (ref 134–144)
Total Protein: 7.7 g/dL (ref 6.0–8.5)
eGFR: 55 mL/min/{1.73_m2} — ABNORMAL LOW (ref >59)

## 2022-08-28 LAB — CBC
Hematocrit: 39.2 % (ref 34.0–46.6)
Hemoglobin: 12.6 g/dL (ref 11.1–15.9)
MCH: 28.6 pg (ref 26.6–33.0)
MCHC: 32.1 g/dL (ref 31.5–35.7)
MCV: 89 fL (ref 79–97)
Platelets: 258 10*3/uL (ref 150–450)
RBC: 4.41 x10E6/uL (ref 3.77–5.28)
RDW: 11.9 % (ref 11.7–15.4)
WBC: 3.6 10*3/uL (ref 3.4–10.8)

## 2022-08-28 LAB — LIPID PANEL
Chol/HDL Ratio: 3.1 ratio (ref 0.0–4.4)
Cholesterol, Total: 173 mg/dL (ref 100–199)
HDL: 55 mg/dL (ref >39)
LDL Chol Calc (NIH): 106 mg/dL — ABNORMAL HIGH (ref 0–99)
Triglycerides: 62 mg/dL (ref 0–149)
VLDL Cholesterol Cal: 12 mg/dL (ref 5–40)

## 2022-08-28 LAB — MICROALBUMIN / CREATININE URINE RATIO
Creatinine, Urine: 58.9 mg/dL
Microalb/Creat Ratio: 25 mg/g creat (ref 0–29)
Microalbumin, Urine: 14.8 ug/mL

## 2022-08-28 LAB — VITAMIN D 25 HYDROXY (VIT D DEFICIENCY, FRACTURES): Vit D, 25-Hydroxy: 17.5 ng/mL — ABNORMAL LOW (ref 30.0–100.0)

## 2022-09-03 LAB — CYTOLOGY - PAP
Comment: NEGATIVE
Diagnosis: NEGATIVE
High risk HPV: NEGATIVE

## 2022-09-10 ENCOUNTER — Other Ambulatory Visit: Payer: Self-pay | Admitting: Nurse Practitioner

## 2022-09-10 MED ORDER — VITAMIN D (ERGOCALCIFEROL) 1.25 MG (50000 UNIT) PO CAPS: 50000.0000 [IU] | ORAL_CAPSULE | ORAL | 1 refills | Status: DC

## 2022-09-23 ENCOUNTER — Ambulatory Visit: Admitting: Nurse Practitioner

## 2022-09-23 ENCOUNTER — Other Ambulatory Visit: Payer: Self-pay | Admitting: Urology

## 2022-09-23 DIAGNOSIS — D49512 Neoplasm of unspecified behavior of left kidney: Secondary | ICD-10-CM

## 2022-09-24 ENCOUNTER — Ambulatory Visit: Admitting: Gastroenterology

## 2022-09-24 ENCOUNTER — Encounter: Payer: Self-pay | Admitting: Gastroenterology

## 2022-09-24 VITALS — BP 130/80 | HR 72 | Ht 69.0 in | Wt 191.0 lb

## 2022-09-24 DIAGNOSIS — Q402 Other specified congenital malformations of stomach: Secondary | ICD-10-CM | POA: Diagnosis not present

## 2022-09-24 DIAGNOSIS — K219 Gastro-esophageal reflux disease without esophagitis: Secondary | ICD-10-CM

## 2022-09-24 DIAGNOSIS — Z8711 Personal history of peptic ulcer disease: Secondary | ICD-10-CM

## 2022-09-24 MED ORDER — OMEPRAZOLE 40 MG PO CPDR: 40.0000 mg | DELAYED_RELEASE_CAPSULE | Freq: Two times a day (BID) | ORAL | 3 refills | Status: DC

## 2022-09-24 NOTE — Patient Instructions (Signed)
You have been scheduled for an endoscopy. Please follow written instructions given to you at your visit today. If you use inhalers (even only as needed), please bring them with you on the day of your procedure.   We have sent the following medications to your pharmacy for you to pick up at your convenience: Omeprazole   Due to recent changes in healthcare laws, you may see the results of your imaging and laboratory studies on MyChart before your provider has had a chance to review them.  We understand that in some cases there may be results that are confusing or concerning to you. Not all laboratory results come back in the same time frame and the provider may be waiting for multiple results in order to interpret others.  Please give [place] 48 hours in order for your provider to thoroughly review all the results before contacting the office for clarification of your results.   Thank you for choosing me and [facility].  Dr. Rush Landmark

## 2022-09-24 NOTE — Progress Notes (Signed)
[facility] VISIT   Primary Care Provider Minette Brine, [address] [place] [address] [phone]  Patient Profile: Nicole Hunt is a 62 y.o. female with a pmh significant for CRI, hypertension, hyperlipidemia, OSA, renal cysts, gastric duplication cyst, GERD.  The patient presents to the [facility] for an evaluation and management of problem(s) noted below:  Problem List 1. History of gastric ulcer   2. History of peptic ulcer disease   3. Gastric duplication cyst   4. Gastroesophageal reflux disease without esophagitis      History of Present Illness Please see prior notes for full details of HPI.  Interval History The patient returns for follow-up.  She has been doing well.  She notes no significant symptoms at this time.  She continues to take her PPI twice daily and this has helped with even small amounts of pyrosis symptoms.  She believes that her previous ulcer disease has likely healed and she is happy with where things stand.  She is ready to move forward with our surveillance endoscopy.    GI Review of Systems Positive as above Negative for dysphagia, odynophagia, decreased appetite, early satiety, bloating, change in bowel habits, melena, hematochezia  Review of Systems General: Denies fevers/chills/weight loss unintentionally Cardiovascular: Denies chest pain Pulmonary: Denies shortness of breath Gastroenterological: See HPI Genitourinary: Denies darkened urine Hematological: Denies easy bruising/bleeding Dermatological: Denies jaundice Psychological: Mood is stable   Medications Current Outpatient Medications  Medication Sig Dispense Refill   amLODipine (NORVASC) 5 MG tablet TAKE 1 TABLET(5 MG) BY MOUTH DAILY 90 tablet 0   diclofenac Sodium (VOLTAREN) 1 % GEL Apply 2 g topically 4 (four) times daily. 100 g 2   losartan (COZAAR) 25 MG tablet Take 25 mg by mouth daily.     Magnesium  250 MG TABS Take 1 tablet (250 mg total) by mouth every evening. Take with evening meal. 90 tablet 1   omeprazole (PRILOSEC) 40 MG capsule Take 1 capsule (40 mg total) by mouth 2 (two) times daily before a meal. 60 capsule 3   Vitamin D, Ergocalciferol, (DRISDOL) 1.25 MG (50000 UNIT) CAPS capsule Take 1 capsule (50,000 Units total) by mouth every 7 (seven) days. (Patient not taking: Reported on 09/24/2022) 12 capsule 1   No current facility-administered medications for this visit.    Allergies No Known Allergies  Histories Past Medical History:  Diagnosis Date   Anemia    Arthritis    Chronic kidney disease    3A   GERD (gastroesophageal reflux disease)    Heart murmur    pt. states beign   HLD (hyperlipidemia)    Hypertension    Kidney cysts    Sleep apnea    cpap   Past Surgical History:  Procedure Laterality Date   BIOPSY  03/06/2022   Procedure: BIOPSY;  Surgeon: Irving Copas., MD;  Location: [facility];  Service: Gastroenterology;;   ESOPHAGOGASTRODUODENOSCOPY N/A 03/06/2022   Procedure: ESOPHAGOGASTRODUODENOSCOPY (EGD);  Surgeon: Irving Copas., MD;  Location: [facility];  Service: Gastroenterology;  Laterality: N/A;   ORIF ANKLE FRACTURE Right 10/12/2021   Procedure: OPEN REDUCTION INTERNAL FIXATION (ORIF) ANKLE FRACTURE WITH SYNDESMOSIS REPAIR;  Surgeon: Armond Hang, MD;  Location: [facility];  Service: Orthopedics;  Laterality: Right;   PARTIAL HYSTERECTOMY     UPPER ESOPHAGEAL ENDOSCOPIC ULTRASOUND (EUS) N/A 03/06/2022   Procedure: UPPER ESOPHAGEAL ENDOSCOPIC ULTRASOUND (EUS);  Surgeon: Irving Copas., MD;  Location: [facility];  Service: Gastroenterology;  Laterality: N/A;   Social History   Socioeconomic History   Marital status: Divorced    Spouse name: Not on file   Number of children: 0   Years of education: Not on file   Highest education level: Not on file  Occupational History   Occupation: retired  Tobacco Use    Smoking status: Never   Smokeless tobacco: Never  Vaping Use   Vaping Use: Never used  Substance and Sexual Activity   Alcohol use: Never   Drug use: Never   Sexual activity: Not on file  Other Topics Concern   Not on file  Social History Narrative   Not on file   Social Determinants of Health   Financial Resource Strain: Not on file  Food Insecurity: Not on file  Transportation Needs: Not on file  Physical Activity: Not on file  Stress: Not on file  Social Connections: Not on file  Intimate Partner Violence: Not on file   Family History  Problem Relation Age of Onset   Breast cancer Mother 10   Hypothyroidism Mother    Arthritis Mother    Stroke Father    Hypertension Father    Kidney disease Father    Hypertension Sister    Hypertension Brother    Healthy Brother    Breast cancer Maternal Aunt        x2 mat aunts   Cancer Maternal Uncle        mouth cancer; d. > 39   Colon cancer Maternal Uncle        d. > 50   Congestive Heart Failure Maternal Grandmother    Breast cancer Cousin        maternal female cousin; dx > 2   Breast cancer Cousin        maternal female cousin; dx > 3   Colon cancer Cousin        maternal female cousin; dx > 50   Esophageal cancer Neg Hx    Inflammatory bowel disease Neg Hx    Liver disease Neg Hx    Pancreatic cancer Neg Hx    Rectal cancer Neg Hx    Stomach cancer Neg Hx    I have reviewed her medical, social, and family history in detail and updated the electronic medical record as necessary.    PHYSICAL EXAMINATION  BP 130/80   Pulse 72   Ht '5\' 9"'$  (1.753 m)   Wt 191 lb (86.6 kg)   SpO2 97%   BMI 28.21 kg/m  Wt Readings from Last 3 Encounters:  09/24/22 191 lb (86.6 kg)  08/27/22 198 lb (89.8 kg)  05/09/22 181 lb 12.8 oz (82.5 kg)  GEN: NAD, appears stated age, doesn't appear chronically ill PSYCH: Cooperative, without pressured speech EYE: Conjunctivae pink, sclerae anicteric ENT: MMM CV: Nontachycardic RESP:  No audible wheezing GI: NABS, soft, NT/ND, without rebound or guarding MSK/EXT: No lower extremity edema SKIN: No jaundice NEURO:  Alert & Oriented x 3, no focal deficits   REVIEW OF DATA  I reviewed the following data at the time of this encounter:  GI Procedures and Studies  June 2023 EUS     Laboratory Studies  Reviewed those in epic  Imaging Studies  No new imaging study to review  ASSESSMENT  Ms. Jernigan is a 62 y.o. female with a pmh significant for CRI, hypertension, hyperlipidemia, OSA, renal cysts, gastric duplication cyst, GERD.  The patient is seen today for evaluation and management of:  1. History  of gastric ulcer   2. History of peptic ulcer disease   3. Gastric duplication cyst   4. Gastroesophageal reflux disease without esophagitis    The patient is clinically and hemodynamically stable.  She will maintain her current PPI dosing until her scheduled surveillance endoscopy to ensure healing of her upper GI tract.  At that point, pending everything looks healed, we will decrease her PPI to once daily dosing.  She would then be able to down titrate that as able based on what helps her be asymptomatic.  We will plan follow-up imaging of the perigastric cyst, most likely duplication cyst, in the summer (1 year from her EUS timing).  Will plan this as a CT abdomen with IV contrast.  Should something change in the way the imaging looks, we can always proceed with an increased risk EUS for aspiration attempt.  The risks and benefits of endoscopic evaluation were discussed with the patient; these include but are not limited to the risk of perforation, infection, bleeding, missed lesions, lack of diagnosis, severe illness requiring hospitalization, as well as anesthesia and sedation related illnesses.  The patient and/or family is agreeable to proceed.  All patient questions were answered to the best of my ability, and the patient agrees to the aforementioned plan of action  with follow-up as indicated.   PLAN  Continue PPI twice daily Proceed with scheduling EGD -If upper GI tract is healed we will start down titrating PPI Likely gastric duplication cyst surveillance with repeat imaging this summer with CT abdomen IV contrast Colon cancer screening in 2027   Orders Placed This Encounter  Procedures   Ambulatory referral to Gastroenterology    New Prescriptions   No medications on file   Modified Medications   Modified Medication Previous Medication   OMEPRAZOLE (PRILOSEC) 40 MG CAPSULE omeprazole (PRILOSEC) 40 MG capsule      Take 1 capsule (40 mg total) by mouth 2 (two) times daily before a meal.    Take 1 capsule (40 mg total) by mouth 2 (two) times daily before a meal.    Planned Follow Up No follow-ups on file.   Total Time in Face-to-Face and in Coordination of Care for patient including independent/personal interpretation/review of prior testing, medical history, examination, medication adjustment, communicating results with the patient directly, and documentation within the EHR is 25 minutes.   Justice Britain, MD [facility] Advanced Endoscopy Office # [phone]

## 2022-09-25 ENCOUNTER — Encounter: Payer: Self-pay | Admitting: Gastroenterology

## 2022-09-27 DIAGNOSIS — R12 Heartburn: Secondary | ICD-10-CM | POA: Insufficient documentation

## 2022-09-27 DIAGNOSIS — Z8711 Personal history of peptic ulcer disease: Secondary | ICD-10-CM | POA: Insufficient documentation

## 2022-10-02 LAB — VITAMIN D 25 HYDROXY (VIT D DEFICIENCY, FRACTURES): Vit D, 25-Hydroxy: 21.5

## 2022-10-02 LAB — PROTEIN / CREATININE RATIO, URINE: Creatinine, Urine: 241.8

## 2022-10-09 ENCOUNTER — Ambulatory Visit
Admission: RE | Admit: 2022-10-09 | Discharge: 2022-10-09 | Disposition: A | Discharge status: Home / Self Care | Source: Ambulatory Visit | Attending: Urology | Admitting: Urology

## 2022-10-09 DIAGNOSIS — D49512 Neoplasm of unspecified behavior of left kidney: Secondary | ICD-10-CM

## 2022-10-09 MED ORDER — GADOPICLENOL 0.5 MMOL/ML IV SOLN
8.0000 mL | Freq: Once | INTRAVENOUS | Status: AC | PRN
  Administered 2022-10-09: 8 mL via INTRAVENOUS

## 2022-10-13 ENCOUNTER — Telehealth: Payer: Self-pay

## 2022-10-13 NOTE — Telephone Encounter (Signed)
-----  Message from Irving Copas., MD sent at 10/12/2022  4:38 AM EST ----- Regarding: 1/30 Opening Nicole Hunt, If you have an opportunity to reach out to this patient.  You can ask if she may have availability to come in on 1/30 since we have a couple openings for EGD.  Certainly if she is not able to have a ride that is fine to keep her current date but I wanted to at least offer her an earlier appointment if she wanted. Thanks. GM

## 2022-10-13 NOTE — Telephone Encounter (Signed)
Left message on machine to call back  

## 2022-10-13 NOTE — Telephone Encounter (Signed)
I have been unable to reach this pt today. I have left several messages.

## 2022-10-13 NOTE — Telephone Encounter (Signed)
No worries Patty. Thank you for trying. GM

## 2022-10-19 ENCOUNTER — Encounter: Payer: Self-pay | Admitting: Nurse Practitioner

## 2022-10-19 DIAGNOSIS — D75A Glucose-6-phosphate dehydrogenase (G6PD) deficiency without anemia: Secondary | ICD-10-CM | POA: Insufficient documentation

## 2022-10-31 ENCOUNTER — Encounter: Payer: Self-pay | Admitting: Gastroenterology

## 2022-10-31 ENCOUNTER — Ambulatory Visit (AMBULATORY_SURGERY_CENTER): Admitting: Gastroenterology

## 2022-10-31 VITALS — BP 131/73 | HR 70 | Temp 98.4°F | Resp 12 | Ht 69.0 in | Wt 191.0 lb

## 2022-10-31 DIAGNOSIS — Z8711 Personal history of peptic ulcer disease: Secondary | ICD-10-CM

## 2022-10-31 DIAGNOSIS — Q402 Other specified congenital malformations of stomach: Secondary | ICD-10-CM | POA: Diagnosis not present

## 2022-10-31 MED ORDER — SODIUM CHLORIDE 0.9 % IV SOLN: 500.0000 mL | Freq: Once | INTRAVENOUS | Status: DC

## 2022-10-31 NOTE — Progress Notes (Signed)
Pt's states no medical or surgical changes since previsit or office visit. VS assessed by [place] ?

## 2022-10-31 NOTE — Patient Instructions (Addendum)
Recommendation:           - The patient will be observed post-procedure,                            until all discharge criteria are met.                           - Discharge patient to home.                           - Patient has a contact number available for                            emergencies. The signs and symptoms of potential                            delayed complications were discussed with the                            patient. Return to normal activities tomorrow.                            Written discharge instructions were provided to the                            patient.                           - Resume previous diet.                           - Decrease PPI to once daily dosing.                           - Continue present medications.                           - The findings and recommendations were discussed                            with the patient.                           - The findings and recommendations were discussed                            with the patient's family.  YOU HAD AN ENDOSCOPIC PROCEDURE TODAY AT [facility]:   Refer to the procedure report that was given to you for any specific questions about what was found during the examination.  If the procedure report does not answer your questions, please call your gastroenterologist to clarify.  If you requested that your care partner not be given the details of your procedure findings, then the procedure report has been included in a sealed envelope for you to review at your convenience later.  YOU SHOULD EXPECT: Some feelings of bloating in the abdomen. Passage of more gas  than usual.  Walking can help get rid of the air that was put into your GI tract during the procedure and reduce the bloating. If you had a lower endoscopy (such as a colonoscopy or flexible sigmoidoscopy) you may notice spotting of blood in your stool or on the toilet paper. If you underwent a bowel prep for your  procedure, you may not have a normal bowel movement for a few days.  Please Note:  You might notice some irritation and congestion in your nose or some drainage.  This is from the oxygen used during your procedure.  There is no need for concern and it should clear up in a day or so.  SYMPTOMS TO REPORT IMMEDIATELY: Following upper endoscopy (EGD)  Vomiting of blood or coffee ground material  New chest pain or pain under the shoulder blades  Painful or persistently difficult swallowing  New shortness of breath  Fever of 100F or higher  Black, tarry-looking stools  For urgent or emergent issues, a gastroenterologist can be reached at any hour by calling [phone]. Do not use MyChart messaging for urgent concerns.   DIET:  We do recommend a small meal at first, but then you may proceed to your regular diet.  Drink plenty of fluids but you should avoid alcoholic beverages for 24 hours.  ACTIVITY:  You should plan to take it easy for the rest of today and you should NOT DRIVE or use heavy machinery until tomorrow (because of the sedation medicines used during the test).    FOLLOW UP: Our staff will call the number listed on your records the next business day following your procedure.  We will call around 7:15- 8:00 am to check on you and address any questions or concerns that you may have regarding the information given to you following your procedure. If we do not reach you, we will leave a message.     If any biopsies were taken you will be contacted by phone or by letter within the next 1-3 weeks.  Please call us at [phone] if you have not heard about the biopsies in 3 weeks.    SIGNATURES/CONFIDENTIALITY: You and/or your care partner have signed paperwork which will be entered into your electronic medical record.  These signatures attest to the fact that that the information above on your After Visit Summary has been reviewed and is understood.  Full responsibility of the  confidentiality of this discharge information lies with you and/or your care-partner.

## 2022-10-31 NOTE — Progress Notes (Unsigned)
PATIENT STable to recovery, report given to RN

## 2022-10-31 NOTE — Op Note (Signed)
[facility] Patient Name: Nicole Hunt Procedure Date: 10/31/2022 4:33 PM MRN: [id number] Endoscopist: Justice Britain , MD, [id number] Age: 62 Referring MD:  Date of Birth: [birth year] Gender: Female Account #: [id number] Procedure:                Upper GI endoscopy Indications:              Follow-up of gastric ulcer Medicines:                Monitored Anesthesia Care Procedure:                Pre-Anesthesia Assessment:                           - Prior to the procedure, a History and Physical                            was performed, and patient medications and                            allergies were reviewed. The patient's tolerance of                            previous anesthesia was also reviewed. The risks                            and benefits of the procedure and the sedation                            options and risks were discussed with the patient.                            All questions were answered, and informed consent                            was obtained. Prior Anticoagulants: The patient has                            taken no anticoagulant or antiplatelet agents. ASA                            Grade Assessment: II - A patient with mild systemic                            disease. After reviewing the risks and benefits,                            the patient was deemed in satisfactory condition to                            undergo the procedure.                           After obtaining informed consent, the endoscope was  passed under direct vision. Throughout the                            procedure, the patient's blood pressure, pulse, and                            oxygen saturations were monitored continuously. The                            Olympus Scope [id number] was introduced through the                            mouth, and advanced to the second part of duodenum.                            The upper GI  endoscopy was accomplished without                            difficulty. The patient tolerated the procedure. Scope In: Scope Out: Findings:                 No gross lesions were noted in the entire esophagus.                           The Z-line was irregular and was found 33 cm from                            the incisors.                           A 4 cm hiatal hernia was present.                           No gross lesions were noted in the entire examined                            stomach. Previous ulcer has healed.                           No gross lesions were noted in the duodenal bulb,                            in the first portion of the duodenum and in the                            second portion of the duodenum. Complications:            No immediate complications. Estimated Blood Loss:     Estimated blood loss was minimal. Estimated blood                            loss: none. Impression:               - No gross lesions in the entire esophagus. Z-line  irregular, 33 cm from the incisors.                           - 4 cm hiatal hernia.                           - No gross lesions in the entire stomach (previous                            ulcer healed).                           - No gross lesions in the duodenal bulb, in the                            first portion of the duodenum and in the second                            portion of the duodenum. Recommendation:           - The patient will be observed post-procedure,                            until all discharge criteria are met.                           - Discharge patient to home.                           - Patient has a contact number available for                            emergencies. The signs and symptoms of potential                            delayed complications were discussed with the                            patient. Return to normal activities tomorrow.                             Written discharge instructions were provided to the                            patient.                           - Resume previous diet.                           - Decrease PPI to once daily dosing.                           - Continue present medications.                           -  The findings and recommendations were discussed                            with the patient.                           - The findings and recommendations were discussed                            with the patient's family. Justice Britain, MD 10/31/2022 4:44:23 PM

## 2022-10-31 NOTE — Progress Notes (Unsigned)
GASTROENTEROLOGY PROCEDURE H&P NOTE   Primary Care Physician: Minette Brine, FNP  HPI: Nicole Hunt is a 62 y.o. female who presents for EGD for follow up PUD.  Past Medical History:  Diagnosis Date   Anemia    Arthritis    Chronic kidney disease    3A   GERD (gastroesophageal reflux disease)    Heart murmur    pt. states beign   HLD (hyperlipidemia)    Hypertension    Kidney cysts    Sleep apnea    cpap   Past Surgical History:  Procedure Laterality Date   BIOPSY  03/06/2022   Procedure: BIOPSY;  Surgeon: Irving Copas., MD;  Location: [facility];  Service: Gastroenterology;;   ESOPHAGOGASTRODUODENOSCOPY N/A 03/06/2022   Procedure: ESOPHAGOGASTRODUODENOSCOPY (EGD);  Surgeon: Irving Copas., MD;  Location: [place];  Service: Gastroenterology;  Laterality: N/A;   ORIF ANKLE FRACTURE Right 10/12/2021   Procedure: OPEN REDUCTION INTERNAL FIXATION (ORIF) ANKLE FRACTURE WITH SYNDESMOSIS REPAIR;  Surgeon: Armond Hang, MD;  Location: [place];  Service: Orthopedics;  Laterality: Right;   PARTIAL HYSTERECTOMY     UPPER ESOPHAGEAL ENDOSCOPIC ULTRASOUND (EUS) N/A 03/06/2022   Procedure: UPPER ESOPHAGEAL ENDOSCOPIC ULTRASOUND (EUS);  Surgeon: Irving Copas., MD;  Location: [facility];  Service: Gastroenterology;  Laterality: N/A;   Current Outpatient Medications  Medication Sig Dispense Refill   amLODipine (NORVASC) 5 MG tablet TAKE 1 TABLET(5 MG) BY MOUTH DAILY 90 tablet 0   diclofenac Sodium (VOLTAREN) 1 % GEL Apply 2 g topically 4 (four) times daily. 100 g 2   losartan (COZAAR) 25 MG tablet Take 25 mg by mouth daily.     Magnesium 250 MG TABS Take 1 tablet (250 mg total) by mouth every evening. Take with evening meal. 90 tablet 1   omeprazole (PRILOSEC) 40 MG capsule Take 1 capsule (40 mg total) by mouth 2 (two) times daily before a meal. 60 capsule 3   Vitamin D, Ergocalciferol, (DRISDOL) 1.25 MG (50000 UNIT) CAPS capsule Take 1 capsule  (50,000 Units total) by mouth every 7 (seven) days. (Patient not taking: Reported on 09/24/2022) 12 capsule 1   No current facility-administered medications for this visit.    Current Outpatient Medications:    amLODipine (NORVASC) 5 MG tablet, TAKE 1 TABLET(5 MG) BY MOUTH DAILY, Disp: 90 tablet, Rfl: 0   diclofenac Sodium (VOLTAREN) 1 % GEL, Apply 2 g topically 4 (four) times daily., Disp: 100 g, Rfl: 2   losartan (COZAAR) 25 MG tablet, Take 25 mg by mouth daily., Disp: , Rfl:    Magnesium 250 MG TABS, Take 1 tablet (250 mg total) by mouth every evening. Take with evening meal., Disp: 90 tablet, Rfl: 1   omeprazole (PRILOSEC) 40 MG capsule, Take 1 capsule (40 mg total) by mouth 2 (two) times daily before a meal., Disp: 60 capsule, Rfl: 3   Vitamin D, Ergocalciferol, (DRISDOL) 1.25 MG (50000 UNIT) CAPS capsule, Take 1 capsule (50,000 Units total) by mouth every 7 (seven) days. (Patient not taking: Reported on 09/24/2022), Disp: 12 capsule, Rfl: 1 No Known Allergies Family History  Problem Relation Age of Onset   Breast cancer Mother 55   Hypothyroidism Mother    Arthritis Mother    Stroke Father    Hypertension Father    Kidney disease Father    Hypertension Sister    Hypertension Brother    Healthy Brother    Breast cancer Maternal Aunt        x2 mat  aunts   Cancer Maternal Uncle        mouth cancer; d. > 11   Colon cancer Maternal Uncle        d. > 50   Congestive Heart Failure Maternal Grandmother    Breast cancer Cousin        maternal female cousin; dx > 44   Breast cancer Cousin        maternal female cousin; dx > 76   Colon cancer Cousin        maternal female cousin; dx > 78   Esophageal cancer Neg Hx    Inflammatory bowel disease Neg Hx    Liver disease Neg Hx    Pancreatic cancer Neg Hx    Rectal cancer Neg Hx    Stomach cancer Neg Hx    Social History   Socioeconomic History   Marital status: Divorced    Spouse name: Not on file   Number of children: 0    Years of education: Not on file   Highest education level: Not on file  Occupational History   Occupation: retired  Tobacco Use   Smoking status: Never   Smokeless tobacco: Never  Vaping Use   Vaping Use: Never used  Substance and Sexual Activity   Alcohol use: Never   Drug use: Never   Sexual activity: Not on file  Other Topics Concern   Not on file  Social History Narrative   Not on file   Social Determinants of Health   Financial Resource Strain: Not on file  Food Insecurity: Not on file  Transportation Needs: Not on file  Physical Activity: Not on file  Stress: Not on file  Social Connections: Not on file  Intimate Partner Violence: Not on file    Physical Exam: There were no vitals filed for this visit. There is no height or weight on file to calculate BMI. GEN: NAD EYE: Sclerae anicteric ENT: MMM CV: Non-tachycardic GI: Soft, NT/ND NEURO:  Alert & Oriented x 3  Lab Results: No results for input(s): "WBC", "HGB", "HCT", "PLT" in the last 72 hours. BMET No results for input(s): "NA", "K", "CL", "CO2", "GLUCOSE", "BUN", "CREATININE", "CALCIUM" in the last 72 hours. LFT No results for input(s): "PROT", "ALBUMIN", "AST", "ALT", "ALKPHOS", "BILITOT", "BILIDIR", "IBILI" in the last 72 hours. PT/INR No results for input(s): "LABPROT", "INR" in the last 72 hours.   Impression / Plan: This is a 62 y.o.female who presents for EGD for follow up PUD.  The risks and benefits of endoscopic evaluation/treatment were discussed with the patient and/or family; these include but are not limited to the risk of perforation, infection, bleeding, missed lesions, lack of diagnosis, severe illness requiring hospitalization, as well as anesthesia and sedation related illnesses.  The patient's history has been reviewed, patient examined, no change in status, and deemed stable for procedure.  The patient and/or family is agreeable to proceed.    Justice Britain, MD [place]  [facility] # [id number]

## 2022-11-02 ENCOUNTER — Other Ambulatory Visit: Payer: Self-pay | Admitting: Nurse Practitioner

## 2022-11-03 ENCOUNTER — Telehealth: Payer: Self-pay | Admitting: *Deleted

## 2022-11-03 NOTE — Telephone Encounter (Signed)
No answer for post procedure call back. Left VM. 

## 2022-12-29 ENCOUNTER — Ambulatory Visit: Admitting: Nurse Practitioner

## 2022-12-29 ENCOUNTER — Encounter: Payer: Self-pay | Admitting: Nurse Practitioner

## 2022-12-29 VITALS — BP 128/78 | HR 81 | Temp 97.9°F | Ht 69.0 in | Wt 198.0 lb

## 2022-12-29 DIAGNOSIS — R010 Benign and innocent cardiac murmurs: Secondary | ICD-10-CM | POA: Insufficient documentation

## 2022-12-29 DIAGNOSIS — E7800 Pure hypercholesterolemia, unspecified: Secondary | ICD-10-CM

## 2022-12-29 DIAGNOSIS — I10 Essential (primary) hypertension: Secondary | ICD-10-CM | POA: Diagnosis not present

## 2022-12-29 DIAGNOSIS — M25511 Pain in right shoulder: Secondary | ICD-10-CM

## 2022-12-29 DIAGNOSIS — N852 Hypertrophy of uterus: Secondary | ICD-10-CM | POA: Insufficient documentation

## 2022-12-29 DIAGNOSIS — D649 Anemia, unspecified: Secondary | ICD-10-CM | POA: Insufficient documentation

## 2022-12-29 DIAGNOSIS — L819 Disorder of pigmentation, unspecified: Secondary | ICD-10-CM | POA: Insufficient documentation

## 2022-12-29 NOTE — Progress Notes (Signed)
I,Sheena H Holbrook,acting as a Neurosurgeon for [name], FNP.,have documented all relevant documentation on the behalf of [name], FNP,as directed by  [name], FNP while in the presence of [name], FNP.    Subjective:     Patient ID: Nicole Hunt , female    DOB: [birth year] , 62 y.o.   MRN: [id number]   Chief Complaint  Patient presents with   Medical Management of Chronic Issues    HPI  Patient presents today for hypertension follow up.  She was released from the urologist and nephrologist in January.   Shoulder pain had improved with the cream but now when trying to do incline/decline shoulder press has pain  She is taking 1000 iu of vitamin d recommended by Nephrology   Shoulder Pain  The pain is present in the right shoulder. This is a recurrent problem. The current episode started more than 1 month ago. There has been no history of extremity trauma. The problem occurs intermittently. The quality of the pain is described as aching. Pertinent negatives include no fever. The symptoms are aggravated by activity. Treatments tried: pain cream. There is no history of diabetes.    Past Medical History:  Diagnosis Date   Anemia    Arthritis    Chronic kidney disease    3A   GERD (gastroesophageal reflux disease)    Heart murmur    pt. states beign   HLD (hyperlipidemia)    Hypertension    Kidney cysts    Sleep apnea    cpap     Family History  Problem Relation Age of Onset   Breast cancer Mother 75   Hypothyroidism Mother    Arthritis Mother    Stroke Father    Hypertension Father    Kidney disease Father    Hypertension Sister    Hypertension Brother    Healthy Brother    Breast cancer Maternal Aunt        x2 mat aunts   Cancer Maternal Uncle        mouth cancer; d. > 50   Colon cancer Maternal Uncle        d. > 50   Congestive Heart Failure Maternal Grandmother    Breast cancer Cousin        maternal female cousin; dx > 50   Breast  cancer Cousin        maternal female cousin; dx > 50   Colon cancer Cousin        maternal female cousin; dx > 50   Esophageal cancer Neg Hx    Inflammatory bowel disease Neg Hx    Liver disease Neg Hx    Pancreatic cancer Neg Hx    Rectal cancer Neg Hx    Stomach cancer Neg Hx      Current Outpatient Medications:    amLODipine (NORVASC) 5 MG tablet, TAKE 1 TABLET(5 MG) BY MOUTH DAILY, Disp: 90 tablet, Rfl: 0   cholecalciferol (VITAMIN D3) 25 MCG (1000 UNIT) tablet, Take 1,000 Units by mouth daily., Disp: , Rfl:    diclofenac Sodium (VOLTAREN) 1 % GEL, Apply 2 g topically 4 (four) times daily., Disp: 100 g, Rfl: 2   losartan (COZAAR) 25 MG tablet, Take 25 mg by mouth daily., Disp: , Rfl:    Magnesium 250 MG TABS, Take 1 tablet (250 mg total) by mouth every evening. Take with evening meal., Disp: 90 tablet, Rfl: 1   omeprazole (PRILOSEC) 40 MG capsule, Take 1 capsule (40  mg total) by mouth 2 (two) times daily before a meal., Disp: 60 capsule, Rfl: 3   No Known Allergies   Review of Systems  Constitutional: Negative.  Negative for fever.  HENT: Negative.    Eyes: Negative.   Respiratory: Negative.  Negative for shortness of breath.   Cardiovascular: Negative.  Negative for chest pain and palpitations.  Gastrointestinal: Negative.   Endocrine: Negative.   Genitourinary: Negative.   Musculoskeletal:  Positive for arthralgias (right shoulder).  Skin: Negative.   Allergic/Immunologic: Negative.   Neurological: Negative.   Hematological: Negative.   Psychiatric/Behavioral: Negative.       Today's Vitals   12/29/22 1014  BP: 128/78  Pulse: 81  Temp: 97.9 F (36.6 C)  TempSrc: Oral  SpO2: 98%  Weight: 198 lb (89.8 kg)  Height:  (1.753 m)   Body mass index is 29.24 kg/m.   Objective:  Physical Exam Vitals reviewed.  Constitutional:      General: She is not in acute distress.    Appearance: Normal appearance.  Cardiovascular:     Rate and Rhythm: Normal rate and  regular rhythm.     Pulses: Normal pulses.     Heart sounds: Normal heart sounds. No murmur heard. Pulmonary:     Effort: Pulmonary effort is normal. No respiratory distress.     Breath sounds: Normal breath sounds. No wheezing.  Neurological:     Mental Status: She is alert.         Assessment And Plan:     1. Essential hypertension Comments: Blood pressure is normal. Continue current medications.  2. Elevated LDL cholesterol level Comments: Cholesterol levels are stable. Continue low fat diet.  3. Acute pain of right shoulder Comments: She is to go to [facility] for her xray that was ordered in July 2023, pain mostly when lifting weights with exercise. Pending result will refer to Ortho.    Patient was given opportunity to ask questions. Patient verbalized understanding of the plan and was able to repeat key elements of the plan. All questions were answered to their satisfaction.  [name], FNP   I, [name], FNP, have reviewed all documentation for this visit. The documentation on 12/29/22 for the exam, diagnosis, procedures, and orders are all accurate and complete.   IF YOU HAVE BEEN REFERRED TO A SPECIALIST, IT MAY TAKE 1-2 WEEKS TO SCHEDULE/PROCESS THE REFERRAL. IF YOU HAVE NOT HEARD FROM US/SPECIALIST IN TWO WEEKS, PLEASE GIVE [place] A CALL AT [phone] X [phone].   THE PATIENT IS ENCOURAGED TO PRACTICE SOCIAL DISTANCING DUE TO THE COVID-19 PANDEMIC.

## 2022-12-30 ENCOUNTER — Encounter: Payer: Self-pay | Admitting: Nurse Practitioner

## 2023-01-30 ENCOUNTER — Encounter: Payer: Self-pay | Admitting: Nurse Practitioner

## 2023-02-10 ENCOUNTER — Encounter: Payer: Self-pay | Admitting: Nurse Practitioner

## 2023-02-10 ENCOUNTER — Other Ambulatory Visit: Payer: Self-pay

## 2023-02-10 MED ORDER — LOSARTAN POTASSIUM 25 MG PO TABS: 25.0000 mg | ORAL_TABLET | Freq: Every day | ORAL | 1 refills | Status: DC

## 2023-02-10 MED ORDER — AMLODIPINE BESYLATE 5 MG PO TABS: ORAL_TABLET | ORAL | 0 refills | Status: DC

## 2023-03-11 ENCOUNTER — Encounter: Payer: Self-pay | Admitting: Nurse Practitioner

## 2023-03-30 ENCOUNTER — Telehealth: Payer: Self-pay

## 2023-03-30 DIAGNOSIS — Q402 Other specified congenital malformations of stomach: Secondary | ICD-10-CM

## 2023-03-30 NOTE — Telephone Encounter (Signed)
-----   Message from Nurse Annaliz Aven P sent at 03/19/2022  9:19 AM EDT ----- 27-month CT abdomen with IV contrast for follow-up of retroperitoneal cyst/duplication cyst.  Follow-up in clinic after EGD has been completed.  Thanks.

## 2023-03-31 NOTE — Telephone Encounter (Signed)
Order entered and sent to the schedulers.  Pt aware

## 2023-04-28 ENCOUNTER — Telehealth: Payer: Self-pay | Admitting: Gastroenterology

## 2023-04-28 NOTE — Telephone Encounter (Signed)
Inbound call from patient returning phone call regarding insurance. Requesting a call back. Please advise, thank you.

## 2023-04-29 ENCOUNTER — Ambulatory Visit (HOSPITAL_COMMUNITY)
Admission: RE | Admit: 2023-04-29 | Discharge: 2023-04-29 | Disposition: A | Discharge status: Home / Self Care | Source: Ambulatory Visit | Attending: Gastroenterology | Admitting: Gastroenterology

## 2023-04-29 DIAGNOSIS — Q402 Other specified congenital malformations of stomach: Secondary | ICD-10-CM | POA: Diagnosis present

## 2023-04-29 DIAGNOSIS — N183 Chronic kidney disease, stage 3 unspecified: Secondary | ICD-10-CM | POA: Insufficient documentation

## 2023-04-29 LAB — POCT I-STAT CREATININE: Creatinine, Ser: 1.8 mg/dL — ABNORMAL HIGH (ref 0.44–1.00)

## 2023-04-29 MED ORDER — IOHEXOL 300 MG/ML  SOLN
75.0000 mL | Freq: Once | INTRAMUSCULAR | Status: AC | PRN
  Administered 2023-04-29: 75 mL via INTRAVENOUS

## 2023-04-29 MED ORDER — SODIUM CHLORIDE (PF) 0.9 % IJ SOLN
INTRAMUSCULAR | Status: AC
  Filled 2023-04-29: qty 50

## 2023-04-30 ENCOUNTER — Encounter: Payer: Self-pay | Admitting: Nurse Practitioner

## 2023-04-30 ENCOUNTER — Ambulatory Visit: Admitting: Nurse Practitioner

## 2023-04-30 VITALS — BP 140/80 | HR 71 | Temp 97.9°F | Ht 69.0 in | Wt 191.2 lb

## 2023-04-30 DIAGNOSIS — Z6828 Body mass index (BMI) 28.0-28.9, adult: Secondary | ICD-10-CM

## 2023-04-30 DIAGNOSIS — N183 Chronic kidney disease, stage 3 unspecified: Secondary | ICD-10-CM | POA: Diagnosis not present

## 2023-04-30 DIAGNOSIS — M25511 Pain in right shoulder: Secondary | ICD-10-CM

## 2023-04-30 DIAGNOSIS — R222 Localized swelling, mass and lump, trunk: Secondary | ICD-10-CM

## 2023-04-30 DIAGNOSIS — E7800 Pure hypercholesterolemia, unspecified: Secondary | ICD-10-CM | POA: Diagnosis not present

## 2023-04-30 DIAGNOSIS — I129 Hypertensive chronic kidney disease with stage 1 through stage 4 chronic kidney disease, or unspecified chronic kidney disease: Secondary | ICD-10-CM | POA: Diagnosis not present

## 2023-04-30 DIAGNOSIS — Z6829 Body mass index (BMI) 29.0-29.9, adult: Secondary | ICD-10-CM | POA: Insufficient documentation

## 2023-04-30 DIAGNOSIS — G8929 Other chronic pain: Secondary | ICD-10-CM

## 2023-04-30 DIAGNOSIS — E559 Vitamin D deficiency, unspecified: Secondary | ICD-10-CM | POA: Diagnosis not present

## 2023-04-30 MED ORDER — AMLODIPINE BESYLATE 5 MG PO TABS
ORAL_TABLET | ORAL | 1 refills | Status: DC
Start: 2023-04-30 — End: 2023-05-01

## 2023-04-30 MED ORDER — LOSARTAN POTASSIUM 25 MG PO TABS
25.0000 mg | ORAL_TABLET | Freq: Every day | ORAL | 1 refills | Status: DC
Start: 2023-04-30 — End: 2024-03-04

## 2023-04-30 NOTE — Assessment & Plan Note (Signed)
Will check vitamin D level and supplement as needed.    Also encouraged to spend 15 minutes in the sun daily.   

## 2023-04-30 NOTE — Assessment & Plan Note (Signed)
Soft lump to right lateral thoracic area, this is likely a lipoma. If has pain or has increase in size she is to make me aware to have an ultrasound.

## 2023-04-30 NOTE — Assessment & Plan Note (Signed)
Cholesterol levels were improved at last check. Will check lipid panel.

## 2023-04-30 NOTE — Progress Notes (Signed)
Madelaine Bhat, CMA,acting as a Neurosurgeon for Nicole Felts, FNP.,have documented all relevant documentation on the behalf of Nicole Felts, FNP,as directed by  Nicole Felts, FNP while in the presence of Nicole Felts, FNP.  Subjective:  Patient ID: Nicole Hunt , female    DOB: [birth year] , 62 y.o.   MRN: [id number]  Chief Complaint  Patient presents with   Hypertension    HPI  Patient presents today for a BP follow up, patient reports compliance with medications. Patient denies any chest pain, SOB, and headaches. Patient reports she never went to get the xray of her shoulder. She has stopped strength training, her right shoulder has not been as bad. She thinks the injury came from a fall while in the [employer]  When she was stationed in [place] she had her colonoscopy  BP Readings from Last 3 Encounters: 04/30/23 : (!) 150/80 12/29/22 : 128/78 10/31/22 : 131/73    Hypertension This is a chronic problem. The current episode started more than 1 month ago. The problem has been gradually worsening since onset. The problem is uncontrolled. Pertinent negatives include no chest pain, headaches, palpitations or shortness of breath. Risk factors for coronary artery disease include sedentary lifestyle. Past treatments include diuretics. The current treatment provides mild improvement. Compliance problems include diet and exercise.  There is no history of kidney disease.     Past Medical History:  Diagnosis Date   Anemia    Arthritis    Chronic kidney disease    3A   GERD (gastroesophageal reflux disease)    Heart murmur    pt. states beign   HLD (hyperlipidemia)    Hypertension    Kidney cysts    Sleep apnea    cpap     Family History  Problem Relation Age of Onset   Breast cancer Mother 51   Hypothyroidism Mother    Arthritis Mother    Stroke Father    Hypertension Father    Kidney disease Father    Hypertension Sister    Hypertension Brother    Healthy Brother     Breast cancer Maternal Aunt        x2 mat aunts   Cancer Maternal Uncle        mouth cancer; d. > 50   Colon cancer Maternal Uncle        d. > 50   Congestive Heart Failure Maternal Grandmother    Breast cancer Cousin        maternal female cousin; dx > 50   Breast cancer Cousin        maternal female cousin; dx > 50   Colon cancer Cousin        maternal female cousin; dx > 50   Esophageal cancer Neg Hx    Inflammatory bowel disease Neg Hx    Liver disease Neg Hx    Pancreatic cancer Neg Hx    Rectal cancer Neg Hx    Stomach cancer Neg Hx      Current Outpatient Medications:    cholecalciferol (VITAMIN D3) 25 MCG (1000 UNIT) tablet, Take 1,000 Units by mouth daily., Disp: , Rfl:    diclofenac Sodium (VOLTAREN) 1 % GEL, Apply 2 g topically 4 (four) times daily., Disp: 100 g, Rfl: 2   Magnesium 250 MG TABS, Take 1 tablet (250 mg total) by mouth every evening. Take with evening meal., Disp: 90 tablet, Rfl: 1   omeprazole (PRILOSEC) 40 MG capsule, Take 1 capsule (40  mg total) by mouth 2 (two) times daily before a meal., Disp: 60 capsule, Rfl: 3   amLODipine (NORVASC) 5 MG tablet, TAKE 1 TABLET(5 MG) BY MOUTH DAILY, Disp: 90 tablet, Rfl: 1   losartan (COZAAR) 25 MG tablet, Take 1 tablet (25 mg total) by mouth daily., Disp: 90 tablet, Rfl: 1   No Known Allergies   Review of Systems  Constitutional: Negative.   HENT: Negative.    Eyes: Negative.   Respiratory: Negative.  Negative for shortness of breath.   Cardiovascular: Negative.  Negative for chest pain, palpitations and leg swelling.  Gastrointestinal: Negative.   Neurological:  Negative for headaches.  Psychiatric/Behavioral: Negative.       Today's Vitals   04/30/23 0823 04/30/23 0846  BP: (!) 150/80 (!) 140/80  Pulse: 71   Temp: 97.9 F (36.6 C)   TempSrc: Oral   Weight: 191 lb 3.2 oz (86.7 kg)   Height: 5\' 9"  (1.753 m)   PainSc: 0-No pain    Body mass index is 28.24 kg/m.  Wt Readings from Last 3 Encounters:   04/30/23 191 lb 3.2 oz (86.7 kg)  12/29/22 198 lb (89.8 kg)  10/31/22 191 lb (86.6 kg)     Objective:  Physical Exam Vitals reviewed.  Constitutional:      General: She is not in acute distress.    Appearance: Normal appearance.  Cardiovascular:     Rate and Rhythm: Normal rate and regular rhythm.     Pulses: Normal pulses.     Heart sounds: Normal heart sounds. No murmur heard. Pulmonary:     Effort: Pulmonary effort is normal. No respiratory distress.     Breath sounds: Normal breath sounds. No wheezing.  Skin:    General: Skin is warm and dry.     Capillary Refill: Capillary refill takes less than 2 seconds.  Neurological:     General: No focal deficit present.     Mental Status: She is alert and oriented to person, place, and time.     Cranial Nerves: No cranial nerve deficit.     Motor: No weakness.  Psychiatric:        Mood and Affect: Mood normal.        Behavior: Behavior normal.        Thought Content: Thought content normal.        Judgment: Judgment normal.         Assessment And Plan:  Benign hypertension with CKD (chronic kidney disease) stage III (HCC) Assessment & Plan: Blood pressure is elevated, repeat is slightly improved. She was rushing to get to the office and was unable to print off her colonoscopy results. Encouraged to focus on lifestyle modifications.  Orders: -     amLODIPine Besylate; TAKE 1 TABLET(5 MG) BY MOUTH DAILY  Dispense: 90 tablet; Refill: 1 -     Losartan Potassium; Take 1 tablet (25 mg total) by mouth daily.  Dispense: 90 tablet; Refill: 1 -     BMP8+eGFR  Elevated LDL cholesterol level Assessment & Plan: Cholesterol levels were improved at last check. Will check lipid panel.   Orders: -     Lipid panel  Vitamin D deficiency Assessment & Plan: Will check vitamin D level and supplement as needed.    Also encouraged to spend 15 minutes in the sun daily.    Orders: -     VITAMIN D 25 Hydroxy (Vit-D Deficiency,  Fractures)  Chronic right shoulder pain Assessment & Plan: Reordered right shoulder xray.  Orders: -     DG Shoulder Right; Future  Lump of skin of back Assessment & Plan: Soft lump to right lateral thoracic area, this is likely a lipoma. If has pain or has increase in size she is to make me aware to have an ultrasound.    Body mass index (BMI) of 28.0 to 28.9 in adult    Return if symptoms worsen or fail to improve, for has appt in December.  Patient was given opportunity to ask questions. Patient verbalized understanding of the plan and was able to repeat key elements of the plan. All questions were answered to their satisfaction.    Jeanell Sparrow, FNP, have reviewed all documentation for this visit. The documentation on 04/30/23 for the exam, diagnosis, procedures, and orders are all accurate and complete.   IF YOU HAVE BEEN REFERRED TO A SPECIALIST, IT MAY TAKE 1-2 WEEKS TO SCHEDULE/PROCESS THE REFERRAL. IF YOU HAVE NOT HEARD FROM US/SPECIALIST IN TWO WEEKS, PLEASE GIVE [place] A CALL AT [phone].

## 2023-04-30 NOTE — Patient Instructions (Signed)
Go to [address] to [facility] for your xray

## 2023-04-30 NOTE — Assessment & Plan Note (Signed)
Reordered right shoulder xray.

## 2023-04-30 NOTE — Assessment & Plan Note (Signed)
Blood pressure is elevated, repeat is slightly improved. She was rushing to get to the office and was unable to print off her colonoscopy results. Encouraged to focus on lifestyle modifications.

## 2023-05-01 ENCOUNTER — Other Ambulatory Visit: Payer: Self-pay | Admitting: Nurse Practitioner

## 2023-05-01 DIAGNOSIS — N183 Chronic kidney disease, stage 3 unspecified: Secondary | ICD-10-CM

## 2023-05-01 LAB — BMP8+EGFR
BUN/Creatinine Ratio: 16 (ref 9–23)
BUN: 21 mg/dL (ref 6–24)
CO2: 21 mmol/L (ref 20–29)
Calcium: 10.2 mg/dL (ref 8.7–10.2)
Chloride: 103 mmol/L (ref 96–106)
Creatinine, Ser: 1.31 mg/dL — ABNORMAL HIGH (ref 0.57–1.00)
Glucose: 91 mg/dL (ref 70–99)
Potassium: 4 mmol/L (ref 3.5–5.2)
Sodium: 139 mmol/L (ref 134–144)
eGFR: 47 mL/min/{1.73_m2} — ABNORMAL LOW (ref >59)

## 2023-05-01 LAB — LIPID PANEL
Chol/HDL Ratio: 3.3 ratio (ref 0.0–4.4)
Cholesterol, Total: 188 mg/dL (ref 100–199)
HDL: 57 mg/dL (ref >39)
LDL Chol Calc (NIH): 121 mg/dL — ABNORMAL HIGH (ref 0–99)
Triglycerides: 51 mg/dL (ref 0–149)
VLDL Cholesterol Cal: 10 mg/dL (ref 5–40)

## 2023-05-01 LAB — VITAMIN D 25 HYDROXY (VIT D DEFICIENCY, FRACTURES): Vit D, 25-Hydroxy: 23.5 ng/mL — ABNORMAL LOW (ref 30.0–100.0)

## 2023-05-01 MED ORDER — VITAMIN D (ERGOCALCIFEROL) 1.25 MG (50000 UNIT) PO CAPS: 50000.0000 [IU] | ORAL_CAPSULE | ORAL | 1 refills | Status: DC

## 2023-06-12 ENCOUNTER — Other Ambulatory Visit: Payer: Self-pay | Admitting: Nurse Practitioner

## 2023-06-12 DIAGNOSIS — Z1231 Encounter for screening mammogram for malignant neoplasm of breast: Secondary | ICD-10-CM

## 2023-06-25 ENCOUNTER — Ambulatory Visit
Admission: RE | Admit: 2023-06-25 | Discharge: 2023-06-25 | Disposition: A | Discharge status: Home / Self Care | Source: Ambulatory Visit

## 2023-06-25 DIAGNOSIS — Z1231 Encounter for screening mammogram for malignant neoplasm of breast: Secondary | ICD-10-CM

## 2023-09-08 ENCOUNTER — Encounter: Admitting: Nurse Practitioner

## 2023-09-10 ENCOUNTER — Encounter: Admitting: Nurse Practitioner

## 2023-09-30 ENCOUNTER — Encounter: Payer: Self-pay | Admitting: Nurse Practitioner

## 2023-09-30 ENCOUNTER — Ambulatory Visit (INDEPENDENT_AMBULATORY_CARE_PROVIDER_SITE_OTHER): Admitting: Nurse Practitioner

## 2023-09-30 VITALS — BP 120/64 | HR 64 | Temp 98.7°F | Ht 69.0 in | Wt 197.2 lb

## 2023-09-30 DIAGNOSIS — Z79899 Other long term (current) drug therapy: Secondary | ICD-10-CM

## 2023-09-30 DIAGNOSIS — Z0000 Encounter for general adult medical examination without abnormal findings: Secondary | ICD-10-CM | POA: Diagnosis not present

## 2023-09-30 DIAGNOSIS — N183 Chronic kidney disease, stage 3 unspecified: Secondary | ICD-10-CM | POA: Diagnosis not present

## 2023-09-30 DIAGNOSIS — E559 Vitamin D deficiency, unspecified: Secondary | ICD-10-CM

## 2023-09-30 DIAGNOSIS — E7800 Pure hypercholesterolemia, unspecified: Secondary | ICD-10-CM

## 2023-09-30 DIAGNOSIS — Z23 Encounter for immunization: Secondary | ICD-10-CM | POA: Insufficient documentation

## 2023-09-30 DIAGNOSIS — Z1211 Encounter for screening for malignant neoplasm of colon: Secondary | ICD-10-CM | POA: Insufficient documentation

## 2023-09-30 DIAGNOSIS — I129 Hypertensive chronic kidney disease with stage 1 through stage 4 chronic kidney disease, or unspecified chronic kidney disease: Secondary | ICD-10-CM

## 2023-09-30 DIAGNOSIS — L84 Corns and callosities: Secondary | ICD-10-CM | POA: Insufficient documentation

## 2023-09-30 LAB — POCT URINALYSIS DIP (CLINITEK)
Bilirubin, UA: NEGATIVE
Blood, UA: NEGATIVE
Glucose, UA: NEGATIVE mg/dL
Ketones, POC UA: NEGATIVE mg/dL
Leukocytes, UA: NEGATIVE
Nitrite, UA: NEGATIVE
POC PROTEIN,UA: NEGATIVE
Spec Grav, UA: 1.01 (ref 1.010–1.025)
Urobilinogen, UA: 0.2 U/dL
pH, UA: 7 (ref 5.0–8.0)

## 2023-09-30 NOTE — Assessment & Plan Note (Addendum)
 Blood pressure is well controlled, continue current medications. EKG done NSR HR 64

## 2023-09-30 NOTE — Assessment & Plan Note (Signed)
Cholesterol levels were improved at last check. Will check lipid panel.

## 2023-09-30 NOTE — Assessment & Plan Note (Signed)
 Will check vitamin D level and supplement as needed.    Also encouraged to spend 15 minutes in the sun daily.

## 2023-09-30 NOTE — Progress Notes (Signed)
 Nicole Hunt, CMA,acting as a Neurosurgeon for Nicole Epley, FNP.,have documented all relevant documentation on the behalf of Nicole Epley, FNP,as directed by  Nicole Epley, FNP while in the presence of Nicole Epley, FNP.  Subjective:    Patient ID: Nicole Hunt , female    DOB: 05-10-1961 , 63 y.o.   MRN: [id number]  Chief Complaint  Patient presents with   Annual Exam    HPI  Patient presents today for HM, Patient reports compliance with medication. Patient denies any chest pain, SOB, or headaches. Patient has no concerns today.  Overall everything is going well. She feels like she has had her colonoscopy at [facility] in [place]       Past Medical History:  Diagnosis Date   Anemia 1996   Advised by PA once while serving in the [facility].   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   Chronic kidney disease Over 10 yrs ago   3A   Fracture 10/12/2021   GERD (gastroesophageal reflux disease) 2018   Heart murmur Over 10 yrs ago   pt. states beign   HLD (hyperlipidemia)    Hypertension Over 10 yrs ago   Noted in my military helath record.   Kidney cysts    Sleep apnea 08 Feb 2019   cpap   Trimalleolar fracture of ankle, closed, right, initial encounter 10/11/2021     Family History  Problem Relation Age of Onset   Breast cancer Mother 32   Hypothyroidism Mother    Arthritis Mother    Cancer Mother    Varicose Veins Mother    Stroke Father    Hypertension Father    Kidney disease Father    Hypertension Sister    Hypertension Brother    Healthy Brother    Breast cancer Maternal Aunt        x2 mat aunts   Cancer Maternal Uncle        mouth cancer; d. > 50   Colon cancer Maternal Uncle        d. > 50   Congestive Heart Failure Maternal Grandmother    Breast cancer Cousin        maternal female cousin; dx > 50   Breast cancer Cousin        maternal female cousin; dx > 50   Colon cancer Cousin        maternal female cousin; dx > 50    Esophageal cancer Neg Hx    Inflammatory bowel disease Neg Hx    Liver disease Neg Hx    Pancreatic cancer Neg Hx    Rectal cancer Neg Hx    Stomach cancer Neg Hx      Current Outpatient Medications:    amLODipine  (NORVASC ) 5 MG tablet, TAKE 1 TABLET(5 MG) BY MOUTH DAILY, Disp: 90 tablet, Rfl: 1   diclofenac  Sodium (VOLTAREN ) 1 % GEL, Apply 2 g topically 4 (four) times daily., Disp: 100 g, Rfl: 2   losartan  (COZAAR ) 25 MG tablet, Take 1 tablet (25 mg total) by mouth daily., Disp: 90 tablet, Rfl: 1   Magnesium  250 MG TABS, Take 1 tablet (250 mg total) by mouth every evening. Take with evening meal., Disp: 90 tablet, Rfl: 1   omeprazole  (PRILOSEC) 40 MG capsule, Take 1 capsule (40 mg total) by mouth 2 (two) times daily before a meal., Disp: 60 capsule, Rfl: 3   Vitamin D , Ergocalciferol , (DRISDOL ) 1.25 MG (50000 UNIT) CAPS capsule, Take 1 capsule (  50,000 Units total) by mouth every 7 (seven) days., Disp: 12 capsule, Rfl: 1   No Known Allergies    The patient states she is status post hysterectomy. Negative for Dysmenorrhea and Negative for Menorrhagia. Negative for: breast discharge, breast lump(s), breast pain and breast self exam. Associated symptoms include abnormal vaginal bleeding. Pertinent negatives include abnormal bleeding (hematology), anxiety, decreased libido, depression, difficulty falling sleep, dyspareunia, history of infertility, nocturia, sexual dysfunction, sleep disturbances, urinary incontinence, urinary urgency, vaginal discharge and vaginal itching. Diet regular; she is trying to eat more vegetables and fruit and occasional fish.The patient states her exercise level is moderate she is running again up to 4 miles a day and has some walking.  The patient's tobacco use is:  Social History   Tobacco Use  Smoking Status Never  Smokeless Tobacco Never  Tobacco Comments   Never used tobacco or anything.  . She has been exposed to passive smoke. The patient's alcohol use  is:  Social History   Substance and Sexual Activity  Alcohol Use Never    Review of Systems  Constitutional: Negative.   HENT: Negative.    Eyes: Negative.   Respiratory: Negative.  Negative for shortness of breath.   Cardiovascular: Negative.  Negative for chest pain, palpitations and leg swelling.  Gastrointestinal: Negative.   Endocrine: Negative.   Genitourinary: Negative.   Musculoskeletal: Negative.   Skin: Negative.   Allergic/Immunologic: Negative.   Neurological: Negative.  Negative for headaches.  Hematological: Negative.   Psychiatric/Behavioral: Negative.    All other systems reviewed and are negative.    Today's Vitals   09/30/23 1607  BP: 120/64  Pulse: 64  Temp: 98.7 F (37.1 C)  TempSrc: Oral  Weight: 197 lb 3.2 oz (89.4 kg)  Height: 5\' 9"  (1.753 m)  PainSc: 0-No pain   Body mass index is 29.12 kg/m.  Wt Readings from Last 3 Encounters:  09/30/23 197 lb 3.2 oz (89.4 kg)  04/30/23 191 lb 3.2 oz (86.7 kg)  12/29/22 198 lb (89.8 kg)     Objective:  Physical Exam Vitals reviewed.  Constitutional:      General: She is not in acute distress.    Appearance: Normal appearance. She is well-developed.     Comments: overweight  HENT:     Head: Normocephalic and atraumatic.     Right Ear: Hearing, tympanic membrane, ear canal and external ear normal. There is no impacted cerumen.     Left Ear: Hearing, tympanic membrane, ear canal and external ear normal. There is no impacted cerumen.     Nose: Nose normal.     Mouth/Throat:     Mouth: Mucous membranes are moist.  Eyes:     General: Lids are normal.     Extraocular Movements: Extraocular movements intact.     Conjunctiva/sclera: Conjunctivae normal.     Pupils: Pupils are equal, round, and reactive to light.     Funduscopic exam:    Right eye: No papilledema.        Left eye: No papilledema.  Neck:     Thyroid: No thyroid mass.     Vascular: No carotid bruit.  Cardiovascular:     Rate and  Rhythm: Normal rate and regular rhythm.     Pulses: Normal pulses.     Heart sounds: Normal heart sounds. No murmur heard. Pulmonary:     Effort: Pulmonary effort is normal. No respiratory distress.     Breath sounds: Normal breath sounds. No wheezing.  Chest:  Chest wall: No mass.  Breasts:    Tanner Score is 5.     Right: Normal. No mass or tenderness.     Left: Normal. No mass or tenderness.  Abdominal:     General: Abdomen is flat. Bowel sounds are normal. There is no distension.     Palpations: Abdomen is soft.     Tenderness: There is no abdominal tenderness.  Genitourinary:    Tanner stage (genital): 5.     Labia:        Right: No rash.        Left: No rash.      Vagina: Normal.     Cervix: Normal.     Uterus: Normal.      Adnexa: Right adnexa normal and left adnexa normal.  Musculoskeletal:        General: No swelling or tenderness. Normal range of motion.     Cervical back: Full passive range of motion without pain, normal range of motion and neck supple.     Right lower leg: No edema.     Left lower leg: No edema.  Lymphadenopathy:     Upper Body:     Right upper body: No supraclavicular, axillary or pectoral adenopathy.     Left upper body: No supraclavicular, axillary or pectoral adenopathy.  Skin:    General: Skin is warm and dry.     Capillary Refill: Capillary refill takes less than 2 seconds.  Neurological:     General: No focal deficit present.     Mental Status: She is alert and oriented to person, place, and time.     Cranial Nerves: No cranial nerve deficit.     Sensory: No sensory deficit.     Motor: No weakness.  Psychiatric:        Mood and Affect: Mood normal.        Behavior: Behavior normal.        Thought Content: Thought content normal.        Judgment: Judgment normal.     Assessment And Plan:     Encounter for annual health examination Assessment & Plan: Behavior modifications discussed and diet history reviewed.   Pt will  continue to exercise regularly and modify diet with low GI, plant based foods and decrease intake of processed foods.  Recommend intake of daily multivitamin, Vitamin D , and calcium.  Recommend mammogram and colonoscopy for preventive screenings, as well as recommend immunizations that include influenza, TDAP, and Shingles    Benign hypertension with CKD (chronic kidney disease) stage III (HCC) Assessment & Plan: Blood pressure is well controlled, continue current medications  Orders: -     EKG 12-Lead -     POCT URINALYSIS DIP (CLINITEK) -     Microalbumin / creatinine urine ratio -     CMP14+EGFR -     Lipid panel  Vitamin D  deficiency Assessment & Plan: Will check vitamin D  level and supplement as needed.    Also encouraged to spend 15 minutes in the sun daily.     Callus Assessment & Plan: Tenderness to her left plantar lateral surface of 5th metatarsal and right plantar surface lateral callus is present not as painful  Orders: -     Ambulatory referral to Podiatry  Need for Tdap vaccination Assessment & Plan: Will give tetanus vaccine today while in office. Refer to order management. TDAP will be administered to adults 81-3 years old every 10 years.   Orders: -  Tdap vaccine greater than or equal to 7yo IM  Other long term (current) drug therapy -     CBC with Differential/Platelet  Elevated LDL cholesterol level Assessment & Plan: Cholesterol levels were improved at last check. Will check lipid panel.     Return for 1 year physical, 6 month bp check.  Patient was given opportunity to ask questions. Patient verbalized understanding of the plan and was able to repeat key elements of the plan. All questions were answered to their satisfaction.   Nicole Epley, FNP  I, Nicole Epley, FNP, have reviewed all documentation for this visit. The documentation on 09/30/23 for the exam, diagnosis, procedures, and orders are all accurate and complete.

## 2023-09-30 NOTE — Patient Instructions (Signed)
 Health Maintenance  Topic Date Due   Pneumococcal Vaccination (1 of 2 - PCV) Never done   Colon Cancer Screening  Never done   COVID-19 Vaccine (7 - 2024-25 season) 10/16/2023*   Mammogram  06/24/2025   Pap with HPV screening  08/28/2027   DTaP/Tdap/Td vaccine (3 - Td or Tdap) 09/29/2033   Flu Shot  Completed   Hepatitis C Screening  Completed   HIV Screening  Completed   Zoster (Shingles) Vaccine  Completed   HPV Vaccine  Aged Out  *Topic was postponed. The date shown is not the original due date.

## 2023-09-30 NOTE — Assessment & Plan Note (Signed)

## 2023-09-30 NOTE — Assessment & Plan Note (Signed)
 Tenderness to her left plantar lateral surface of 5th metatarsal and right plantar surface lateral callus is present not as painful

## 2023-09-30 NOTE — Assessment & Plan Note (Signed)
 Will give tetanus vaccine today while in office. Refer to order management. TDAP will be administered to adults 44-61 years old every 10 years.

## 2023-10-01 LAB — CBC WITH DIFFERENTIAL/PLATELET
Basophils Absolute: 0 10*3/uL (ref 0.0–0.2)
Basos: 1 %
EOS (ABSOLUTE): 0 10*3/uL (ref 0.0–0.4)
Eos: 1 %
Hematocrit: 40.4 % (ref 34.0–46.6)
Hemoglobin: 12.7 g/dL (ref 11.1–15.9)
Immature Grans (Abs): 0 10*3/uL (ref 0.0–0.1)
Immature Granulocytes: 0 %
Lymphocytes Absolute: 1.5 10*3/uL (ref 0.7–3.1)
Lymphs: 47 %
MCH: 27.9 pg (ref 26.6–33.0)
MCHC: 31.4 g/dL — ABNORMAL LOW (ref 31.5–35.7)
MCV: 89 fL (ref 79–97)
Monocytes Absolute: 0.3 10*3/uL (ref 0.1–0.9)
Monocytes: 10 %
Neutrophils Absolute: 1.4 10*3/uL (ref 1.4–7.0)
Neutrophils: 41 %
Platelets: 253 10*3/uL (ref 150–450)
RBC: 4.56 x10E6/uL (ref 3.77–5.28)
RDW: 11.8 % (ref 11.7–15.4)
WBC: 3.3 10*3/uL — ABNORMAL LOW (ref 3.4–10.8)

## 2023-10-01 LAB — LIPID PANEL
Chol/HDL Ratio: 3.1 {ratio} (ref 0.0–4.4)
Cholesterol, Total: 187 mg/dL (ref 100–199)
HDL: 61 mg/dL (ref >39)
LDL Chol Calc (NIH): 116 mg/dL — ABNORMAL HIGH (ref 0–99)
Triglycerides: 54 mg/dL (ref 0–149)
VLDL Cholesterol Cal: 10 mg/dL (ref 5–40)

## 2023-10-01 LAB — CMP14+EGFR
ALT: 13 [IU]/L (ref 0–32)
AST: 24 [IU]/L (ref 0–40)
Albumin: 4.4 g/dL (ref 3.8–4.9)
Alkaline Phosphatase: 90 [IU]/L (ref 44–121)
BUN/Creatinine Ratio: 12 (ref 12–28)
BUN: 12 mg/dL (ref 8–27)
Bilirubin Total: 1.7 mg/dL — ABNORMAL HIGH (ref 0.0–1.2)
CO2: 21 mmol/L (ref 20–29)
Calcium: 10.3 mg/dL (ref 8.7–10.3)
Chloride: 104 mmol/L (ref 96–106)
Creatinine, Ser: 1.03 mg/dL — ABNORMAL HIGH (ref 0.57–1.00)
Globulin, Total: 3.3 g/dL (ref 1.5–4.5)
Glucose: 81 mg/dL (ref 70–99)
Potassium: 3.9 mmol/L (ref 3.5–5.2)
Sodium: 141 mmol/L (ref 134–144)
Total Protein: 7.7 g/dL (ref 6.0–8.5)
eGFR: 62 mL/min/{1.73_m2} (ref >59)

## 2023-10-02 LAB — MICROALBUMIN / CREATININE URINE RATIO
Creatinine, Urine: 49.9 mg/dL
Microalb/Creat Ratio: 11 mg/g{creat} (ref 0–29)
Microalbumin, Urine: 5.5 ug/mL

## 2023-10-12 ENCOUNTER — Ambulatory Visit (INDEPENDENT_AMBULATORY_CARE_PROVIDER_SITE_OTHER)

## 2023-10-12 ENCOUNTER — Ambulatory Visit: Admitting: Podiatry

## 2023-10-12 ENCOUNTER — Encounter: Payer: Self-pay | Admitting: Podiatry

## 2023-10-12 DIAGNOSIS — M778 Other enthesopathies, not elsewhere classified: Secondary | ICD-10-CM | POA: Diagnosis not present

## 2023-10-12 DIAGNOSIS — D2372 Other benign neoplasm of skin of left lower limb, including hip: Secondary | ICD-10-CM | POA: Diagnosis not present

## 2023-10-12 NOTE — Progress Notes (Signed)
Chief Complaint  Patient presents with   Foot Pain    Patient states she has "always had issues with her feet but she has a [place] at the bottom of her left foot by her hallux is very painful, no medication for pain . This has been going on for a few months now. It is not to painful patient states but the pain is not going away."    Subjective: 63 y.o. female presenting to the office today as a new patient for evaluation of symptomatic skin lesions to the plantar aspect of the bilateral forefoot left.  Long history of symptomatic lesion developing while she was in the [employer].  Over the last few months they have increasingly become more painful.  She presents for further treatment evaluation   Past Medical History:  Diagnosis Date   Anemia 1996   Advised by PA once while serving in the [facility].   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   Chronic kidney disease Over 10 yrs ago   3A   Fracture 10/12/2021   GERD (gastroesophageal reflux disease) 2018   Heart murmur Over 10 yrs ago   pt. states beign   HLD (hyperlipidemia)    Hypertension Over 10 yrs ago   Noted in my military helath record.   Kidney cysts    Sleep apnea 08 Feb 2019   cpap   Trimalleolar fracture of ankle, closed, right, initial encounter 10/11/2021    Past Surgical History:  Procedure Laterality Date   ABDOMINAL HYSTERECTOMY  2009   Partial Hysterectomy due to fibroids.   BIOPSY  03/06/2022   Procedure: BIOPSY;  Surgeon: Meridee Score Netty Starring., MD;  Location: [facility] ENDOSCOPY;  Service: Gastroenterology;;   ESOPHAGOGASTRODUODENOSCOPY N/A 03/06/2022   Procedure: ESOPHAGOGASTRODUODENOSCOPY (EGD);  Surgeon: Lemar Lofty., MD;  Location: [facility] ENDOSCOPY;  Service: Gastroenterology;  Laterality: N/A;   FRACTURE SURGERY  October 11, 2021   Right broken ankle   ORIF ANKLE FRACTURE Right 10/12/2021   Procedure: OPEN REDUCTION INTERNAL FIXATION (ORIF) ANKLE FRACTURE WITH SYNDESMOSIS REPAIR;   Surgeon: Netta Cedars, MD;  Location: [facility] OR;  Service: Orthopedics;  Laterality: Right;   PARTIAL HYSTERECTOMY     UPPER ESOPHAGEAL ENDOSCOPIC ULTRASOUND (EUS) N/A 03/06/2022   Procedure: UPPER ESOPHAGEAL ENDOSCOPIC ULTRASOUND (EUS);  Surgeon: Lemar Lofty., MD;  Location: [facility] ENDOSCOPY;  Service: Gastroenterology;  Laterality: N/A;    No Known Allergies   Objective:  Physical Exam General: Alert and oriented x3 in no acute distress  Dermatology: Hyperkeratotic lesion(s) present on the plantar aspect of the left forefoot. Pain on palpation with a central nucleated core noted. Skin is warm, dry and supple bilateral lower extremities. Negative for open lesions or macerations.  Vascular: Palpable pedal pulses bilaterally. No edema or erythema noted. Capillary refill within normal limits.  Neurological: Grossly intact via light touch  Musculoskeletal Exam: Pain on palpation at the keratotic lesion(s) noted. Range of motion within normal limits bilateral. Muscle strength 5/5 in all groups bilateral.  Assessment: 1.  Symptomatic eccrine poroma left forefoot; multiple   Plan of Care:  -Patient evaluated -Excisional debridement of keratoic lesion(s) using a chisel blade was performed without incident.  Patient felt significant relief with debridement -Salicylic acid applied with a bandaid -Recommend OTC salicylic acid as needed -Return to the clinic PRN.   Felecia Shelling, DPM [facility]  Dr. Felecia Shelling, DPM    [address]  [place], [place] [address]                Office [phone]  Fax [phone]

## 2023-10-26 ENCOUNTER — Other Ambulatory Visit: Payer: Self-pay | Admitting: Nurse Practitioner

## 2023-11-18 ENCOUNTER — Other Ambulatory Visit: Payer: Self-pay | Admitting: Nurse Practitioner

## 2023-11-18 DIAGNOSIS — N183 Chronic kidney disease, stage 3 unspecified: Secondary | ICD-10-CM

## 2024-02-02 ENCOUNTER — Other Ambulatory Visit: Payer: Self-pay | Admitting: Nurse Practitioner

## 2024-03-04 ENCOUNTER — Other Ambulatory Visit: Payer: Self-pay

## 2024-03-04 DIAGNOSIS — I129 Hypertensive chronic kidney disease with stage 1 through stage 4 chronic kidney disease, or unspecified chronic kidney disease: Secondary | ICD-10-CM

## 2024-03-04 DIAGNOSIS — N183 Chronic kidney disease, stage 3 unspecified: Secondary | ICD-10-CM

## 2024-03-04 MED ORDER — LOSARTAN POTASSIUM 25 MG PO TABS: 25.0000 mg | ORAL_TABLET | Freq: Every day | ORAL | 1 refills | Status: AC

## 2024-03-28 NOTE — Progress Notes (Signed)
 Nicole Hunt, CMA,acting as a Neurosurgeon for [name], FNP.,have documented all relevant documentation on the behalf of [name], FNP,as directed by  [name], FNP while in the presence of [name], FNP.  Subjective:  Patient ID: Nicole Hunt , female    DOB: [birth year] , 63 y.o.   MRN: [id number]  Chief Complaint  Patient presents with   Hypertension    Patient presents today for a bp follow up, Patient reports compliance with medication. Patient denies any chest pain, SOB, or headaches. Patient has no concerns today.     HPI  Patient presents today for hypertension follow up. When she was running last week she felt like she had muffling to her hearing. When she awakens in the morning one of her ears feels wet but nothing comes out. She was in the [employer].    Hypertension This is a chronic problem. The current episode started more than 1 month ago. The problem has been gradually worsening since onset. The problem is uncontrolled. Pertinent negatives include no chest pain, headaches, palpitations or shortness of breath. Risk factors for coronary artery disease include sedentary lifestyle. Past treatments include diuretics. The current treatment provides mild improvement. Compliance problems include diet and exercise.  There is no history of kidney disease.     Past Medical History:  Diagnosis Date   Anemia 1996   Advised by PA once while serving in the [facility].   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   Cataract Over 10 yrs ago   Notes in my military health record.   Chronic kidney disease Over 10 yrs ago   3A   Fracture 10/12/2021   GERD (gastroesophageal reflux disease) 2018   Heart murmur Over 10 yrs ago   pt. states beign   HLD (hyperlipidemia)    Hypertension Over 10 yrs ago   Noted in my military helath record.   Kidney cysts    Sleep apnea 08 Feb 2019   cpap   Trimalleolar fracture of ankle, closed, right, initial encounter  10/11/2021   Ulcer March 06, 2022   Taking Omeprazole  40MG      Family History  Problem Relation Age of Onset   Breast cancer Mother 66   Hypothyroidism Mother    Arthritis Mother    Cancer Mother    Varicose Veins Mother    Stroke Father    Hypertension Father    Kidney disease Father    Hypertension Sister    Hypertension Brother    Healthy Brother    Breast cancer Maternal Aunt        x2 mat aunts   Cancer Maternal Uncle        mouth cancer; d. > 50   Colon cancer Maternal Uncle        d. > 50   Congestive Heart Failure Maternal Grandmother    Breast cancer Cousin        maternal female cousin; dx > 50   Breast cancer Cousin        maternal female cousin; dx > 50   Colon cancer Cousin        maternal female cousin; dx > 50   Esophageal cancer Neg Hx    Inflammatory bowel disease Neg Hx    Liver disease Neg Hx    Pancreatic cancer Neg Hx    Rectal cancer Neg Hx    Stomach cancer Neg Hx      Current Outpatient Medications:  amLODipine  (NORVASC ) 5 MG tablet, TAKE 1 TABLET(5 MG) BY MOUTH DAILY, Disp: 90 tablet, Rfl: 2   losartan  (COZAAR ) 25 MG tablet, Take 1 tablet (25 mg total) by mouth daily., Disp: 90 tablet, Rfl: 1   Magnesium  250 MG TABS, Take 1 tablet (250 mg total) by mouth every evening. Take with evening meal., Disp: 90 tablet, Rfl: 1   Vitamin D , Ergocalciferol , (DRISDOL ) 1.25 MG (50000 UNIT) CAPS capsule, TAKE 1 CAPSULE BY MOUTH EVERY 7 DAYS, Disp: 12 capsule, Rfl: 1   No Known Allergies   Review of Systems  Constitutional: Negative.   HENT:  Positive for hearing loss.   Respiratory:  Negative for shortness of breath.   Cardiovascular:  Negative for chest pain and palpitations.  Neurological:  Negative for headaches.  Psychiatric/Behavioral: Negative.       Today's Vitals   03/29/24 0825 03/29/24 0840  BP: (!) 140/80 120/80  Pulse: 64   Temp: 98.1 F (36.7 C)   TempSrc: Oral   Weight: 205 lb 12.8 oz (93.4 kg)   Height: 5' 9 (1.753 m)    PainSc: 0-No pain    Body mass index is 30.39 kg/m.  Wt Readings from Last 3 Encounters:  03/29/24 205 lb 12.8 oz (93.4 kg)  09/30/23 197 lb 3.2 oz (89.4 kg)  04/30/23 191 lb 3.2 oz (86.7 kg)     Objective:  Physical Exam Vitals and nursing note reviewed.  Constitutional:      General: She is not in acute distress.    Appearance: Normal appearance. She is obese.  Cardiovascular:     Rate and Rhythm: Normal rate and regular rhythm.     Pulses: Normal pulses.     Heart sounds: Normal heart sounds. No murmur heard. Pulmonary:     Effort: Pulmonary effort is normal. No respiratory distress.     Breath sounds: Normal breath sounds. No wheezing.  Skin:    General: Skin is warm and dry.     Capillary Refill: Capillary refill takes less than 2 seconds.  Neurological:     General: No focal deficit present.     Mental Status: She is alert and oriented to person, place, and time.     Cranial Nerves: No cranial nerve deficit.     Motor: No weakness.  Psychiatric:        Mood and Affect: Mood normal.        Behavior: Behavior normal.        Thought Content: Thought content normal.        Judgment: Judgment normal.      Assessment And Plan:  Benign hypertension with CKD (chronic kidney disease) stage III (HCC) Assessment & Plan: Blood pressure is controlled, slightly elevated with improvement with recheck, continue current medications.   Orders: -     BMP8+eGFR  Bilateral hearing loss, unspecified hearing loss type Assessment & Plan: Bilateral bulging TMs, given Zyrtec samples to take at bedtime. Hearing test done   Need for pneumococcal vaccination Assessment & Plan: Pneumonia 20 given in office.   Orders: -     Pneumococcal conjugate vaccine 20-valent  Class 1 obesity due to excess calories with body mass index (BMI) of 30.0 to 30.9 in adult, unspecified whether serious comorbidity present Assessment & Plan: She is encouraged to strive for BMI less than 30 to  decrease cardiac risk. Advised to aim for at least 150 minutes of exercise per week.     Return for keep same next.  Patient was given  opportunity to ask questions. Patient verbalized understanding of the plan and was able to repeat key elements of the plan. All questions were answered to their satisfaction.    Nicole [name], FNP, have reviewed all documentation for this visit. The documentation on 03/29/24 for the exam, diagnosis, procedures, and orders are all accurate and complete.   IF YOU HAVE BEEN REFERRED TO A SPECIALIST, IT MAY TAKE 1-2 WEEKS TO SCHEDULE/PROCESS THE REFERRAL. IF YOU HAVE NOT HEARD FROM US /SPECIALIST IN TWO WEEKS, PLEASE GIVE US  A CALL AT [phone].

## 2024-03-29 ENCOUNTER — Ambulatory Visit: Admitting: Nurse Practitioner

## 2024-03-29 ENCOUNTER — Encounter: Payer: Self-pay | Admitting: Nurse Practitioner

## 2024-03-29 VITALS — BP 120/80 | HR 64 | Temp 98.1°F | Ht 69.0 in | Wt 205.8 lb

## 2024-03-29 DIAGNOSIS — I129 Hypertensive chronic kidney disease with stage 1 through stage 4 chronic kidney disease, or unspecified chronic kidney disease: Secondary | ICD-10-CM

## 2024-03-29 DIAGNOSIS — Z6830 Body mass index (BMI) 30.0-30.9, adult: Secondary | ICD-10-CM

## 2024-03-29 DIAGNOSIS — E66811 Obesity, class 1: Secondary | ICD-10-CM

## 2024-03-29 DIAGNOSIS — N183 Chronic kidney disease, stage 3 unspecified: Secondary | ICD-10-CM

## 2024-03-29 DIAGNOSIS — H9193 Unspecified hearing loss, bilateral: Secondary | ICD-10-CM

## 2024-03-29 DIAGNOSIS — Z23 Encounter for immunization: Secondary | ICD-10-CM | POA: Diagnosis not present

## 2024-03-29 DIAGNOSIS — E6609 Other obesity due to excess calories: Secondary | ICD-10-CM

## 2024-03-29 NOTE — Assessment & Plan Note (Signed)
 Pneumonia 20 given in office.

## 2024-03-29 NOTE — Assessment & Plan Note (Signed)
 Blood pressure is controlled, slightly elevated with improvement with recheck, continue current medications.

## 2024-03-29 NOTE — Assessment & Plan Note (Signed)
 She is encouraged to strive for BMI less than 30 to decrease cardiac risk. Advised to aim for at least 150 minutes of exercise per week.

## 2024-03-29 NOTE — Assessment & Plan Note (Signed)
 Bilateral bulging TMs, given Zyrtec samples to take at bedtime. Hearing test done

## 2024-03-30 LAB — BMP8+EGFR
BUN/Creatinine Ratio: 14 (ref 12–28)
BUN: 18 mg/dL (ref 8–27)
CO2: 19 mmol/L — ABNORMAL LOW (ref 20–29)
Calcium: 10.1 mg/dL (ref 8.7–10.3)
Chloride: 106 mmol/L (ref 96–106)
Creatinine, Ser: 1.26 mg/dL — ABNORMAL HIGH (ref 0.57–1.00)
Glucose: 93 mg/dL (ref 70–99)
Potassium: 4.8 mmol/L (ref 3.5–5.2)
Sodium: 139 mmol/L (ref 134–144)
eGFR: 49 mL/min/1.73 — ABNORMAL LOW (ref >59)

## 2024-04-06 ENCOUNTER — Ambulatory Visit: Payer: Self-pay | Admitting: Nurse Practitioner

## 2024-06-01 ENCOUNTER — Other Ambulatory Visit: Payer: Self-pay | Admitting: Nurse Practitioner

## 2024-06-01 DIAGNOSIS — Z1231 Encounter for screening mammogram for malignant neoplasm of breast: Secondary | ICD-10-CM

## 2024-06-27 ENCOUNTER — Encounter

## 2024-06-27 DIAGNOSIS — Z1231 Encounter for screening mammogram for malignant neoplasm of breast: Secondary | ICD-10-CM

## 2024-06-28 ENCOUNTER — Ambulatory Visit
Admission: RE | Admit: 2024-06-28 | Discharge: 2024-06-28 | Disposition: A | Discharge status: Home / Self Care | Source: Ambulatory Visit | Attending: Nurse Practitioner | Admitting: Nurse Practitioner

## 2024-06-28 DIAGNOSIS — Z1231 Encounter for screening mammogram for malignant neoplasm of breast: Secondary | ICD-10-CM

## 2024-10-03 NOTE — Progress Notes (Unsigned)
 Nicole Hunt, CMA,acting as a neurosurgeon for Nicole Ada, FNP.,have documented all relevant documentation on the behalf of Nicole Ada, FNP,as directed by  Nicole Ada, FNP while in the presence of Nicole Ada, FNP.  Subjective:    Patient ID: Nicole Hunt , female    DOB: 1961-03-30 , 64 y.o.   MRN: [id number]  No chief complaint on file.   HPI  HPI   Past Medical History:  Diagnosis Date   Anemia 1996   Advised by PA once while serving in the [facility].   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   Cataract Over 10 yrs ago   Notes in my military health record.   Chronic kidney disease Over 10 yrs ago   3A   Fracture 10/12/2021   GERD (gastroesophageal reflux disease) 2018   Heart murmur Over 10 yrs ago   pt. states beign   HLD (hyperlipidemia)    Hypertension Over 10 yrs ago   Noted in my military helath record.   Kidney cysts    Sleep apnea 08 Feb 2019   cpap   Trimalleolar fracture of ankle, closed, right, initial encounter 10/11/2021   Ulcer March 06, 2022   Taking Omeprazole  40MG      Family History  Problem Relation Age of Onset   Breast cancer Mother 53   Hypothyroidism Mother    Arthritis Mother    Cancer Mother    Varicose Veins Mother    Stroke Father    Hypertension Father    Kidney disease Father    Hypertension Sister    Hypertension Brother    Healthy Brother    Breast cancer Maternal Aunt        x2 mat aunts   Cancer Maternal Uncle        mouth cancer; d. > 50   Colon cancer Maternal Uncle        d. > 50   Congestive Heart Failure Maternal Grandmother    Breast cancer Cousin        maternal female cousin; dx > 50   Breast cancer Cousin        maternal female cousin; dx > 50   Colon cancer Cousin        maternal female cousin; dx > 50   Esophageal cancer Neg Hx    Inflammatory bowel disease Neg Hx    Liver disease Neg Hx    Pancreatic cancer Neg Hx    Rectal cancer Neg Hx    Stomach cancer Neg Hx     Current  Medications[1]   Allergies[2]    The patient states she uses {contraceptive methods:5051} for birth control. No LMP recorded. Patient has had a hysterectomy.. {Dysmenorrhea-menorrhagia:21918}. Negative for: breast discharge, breast lump(s), breast pain and breast self exam. Associated symptoms include abnormal vaginal bleeding. Pertinent negatives include abnormal bleeding (hematology), anxiety, decreased libido, depression, difficulty falling sleep, dyspareunia, history of infertility, nocturia, sexual dysfunction, sleep disturbances, urinary incontinence, urinary urgency, vaginal discharge and vaginal itching. Diet regular.The patient states her exercise level is    . The patient's tobacco use is: Tobacco Use History[3]. She has been exposed to passive smoke. The patient's alcohol use is:  Social History   Substance and Sexual Activity  Alcohol Use Never  . Additional information: Last pap ***, next one scheduled for ***.    Review of Systems   There were no vitals filed for this visit. There is no height or weight on file to  calculate BMI.  Wt Readings from Last 3 Encounters:  03/29/24 205 lb 12.8 oz (93.4 kg)  09/30/23 197 lb 3.2 oz (89.4 kg)  04/30/23 191 lb 3.2 oz (86.7 kg)     Objective:  Physical Exam      Assessment And Plan:     Encounter for annual health examination  Benign hypertension with CKD (chronic kidney disease) stage III (HCC)  Vitamin D  deficiency  Elevated LDL cholesterol level     No follow-ups on file. Patient was given opportunity to ask questions. Patient verbalized understanding of the plan and was able to repeat key elements of the plan. All questions were answered to their satisfaction.   Nicole Ada, FNP  I, Nicole Ada, FNP, have reviewed all documentation for this visit. The documentation on 10/03/24 for the exam, diagnosis, procedures, and orders are all accurate and complete.     [1]  Current Outpatient Medications:    amLODipine   (NORVASC ) 5 MG tablet, TAKE 1 TABLET(5 MG) BY MOUTH DAILY, Disp: 90 tablet, Rfl: 2   losartan  (COZAAR ) 25 MG tablet, Take 1 tablet (25 mg total) by mouth daily., Disp: 90 tablet, Rfl: 1   Magnesium  250 MG TABS, Take 1 tablet (250 mg total) by mouth every evening. Take with evening meal., Disp: 90 tablet, Rfl: 1   Vitamin D , Ergocalciferol , (DRISDOL ) 1.25 MG (50000 UNIT) CAPS capsule, TAKE 1 CAPSULE BY MOUTH EVERY 7 DAYS, Disp: 12 capsule, Rfl: 1 [2] No Known Allergies [3]  Social History Tobacco Use  Smoking Status Never  Smokeless Tobacco Never  Tobacco Comments   Never used tobacco or anything.

## 2024-10-04 ENCOUNTER — Ambulatory Visit: Admitting: Nurse Practitioner

## 2024-10-04 VITALS — BP 120/80 | HR 72 | Temp 97.7°F | Ht 69.0 in | Wt 202.2 lb

## 2024-10-04 DIAGNOSIS — Z1211 Encounter for screening for malignant neoplasm of colon: Secondary | ICD-10-CM | POA: Diagnosis not present

## 2024-10-04 DIAGNOSIS — E7800 Pure hypercholesterolemia, unspecified: Secondary | ICD-10-CM

## 2024-10-04 DIAGNOSIS — Z79899 Other long term (current) drug therapy: Secondary | ICD-10-CM

## 2024-10-04 DIAGNOSIS — Z0000 Encounter for general adult medical examination without abnormal findings: Secondary | ICD-10-CM | POA: Diagnosis not present

## 2024-10-04 DIAGNOSIS — E559 Vitamin D deficiency, unspecified: Secondary | ICD-10-CM

## 2024-10-04 DIAGNOSIS — Z13228 Encounter for screening for other metabolic disorders: Secondary | ICD-10-CM

## 2024-10-04 DIAGNOSIS — N183 Chronic kidney disease, stage 3 unspecified: Secondary | ICD-10-CM | POA: Diagnosis not present

## 2024-10-04 DIAGNOSIS — Z6829 Body mass index (BMI) 29.0-29.9, adult: Secondary | ICD-10-CM | POA: Diagnosis not present

## 2024-10-04 DIAGNOSIS — I129 Hypertensive chronic kidney disease with stage 1 through stage 4 chronic kidney disease, or unspecified chronic kidney disease: Secondary | ICD-10-CM

## 2024-10-04 LAB — POCT URINALYSIS DIP (CLINITEK)
Bilirubin, UA: NEGATIVE
Glucose, UA: NEGATIVE mg/dL
Ketones, POC UA: NEGATIVE mg/dL
Leukocytes, UA: NEGATIVE
Nitrite, UA: NEGATIVE
POC PROTEIN,UA: NEGATIVE
Spec Grav, UA: 1.015
Urobilinogen, UA: 0.2 U/dL — AB
pH, UA: 6

## 2024-10-04 NOTE — Assessment & Plan Note (Signed)
 Cholesterol levels continue to be stable. Will check lipid panel.

## 2024-10-04 NOTE — Assessment & Plan Note (Signed)
 Actively managing weight through exercise and diet. - Continue running 12-20 miles per week. - Maintain diet with salads, extra virgin olive oil, protein, and fish.

## 2024-10-04 NOTE — Assessment & Plan Note (Addendum)
 Blood pressure is well controlled, continue current medications. EKG done with SR.  Overall she is doing very well.

## 2024-10-04 NOTE — Assessment & Plan Note (Addendum)
 We have been unable to get her records from [facility], will refer for colonoscopy. She thinks she had done in 2018. Need previous colonoscopy records for continuity of care. - Obtain previous colonoscopy records from [place]. - Consider GI referral for assistance in retrieving records.

## 2024-10-04 NOTE — Assessment & Plan Note (Signed)
 Will check vitamin D  level and supplement as needed.    Also encouraged to spend 15 minutes in the sun daily.

## 2024-10-05 LAB — CMP14+EGFR
ALT: 12 IU/L (ref 0–32)
AST: 23 IU/L (ref 0–40)
Albumin: 4.4 g/dL (ref 3.9–4.9)
Alkaline Phosphatase: 78 IU/L (ref 49–135)
BUN/Creatinine Ratio: 10 — ABNORMAL LOW (ref 12–28)
BUN: 12 mg/dL (ref 8–27)
Bilirubin Total: 2.1 mg/dL — ABNORMAL HIGH (ref 0.0–1.2)
CO2: 23 mmol/L (ref 20–29)
Calcium: 10.4 mg/dL — ABNORMAL HIGH (ref 8.7–10.3)
Chloride: 103 mmol/L (ref 96–106)
Creatinine, Ser: 1.19 mg/dL — ABNORMAL HIGH (ref 0.57–1.00)
Globulin, Total: 3.2 g/dL (ref 1.5–4.5)
Glucose: 88 mg/dL (ref 70–99)
Potassium: 5 mmol/L (ref 3.5–5.2)
Sodium: 139 mmol/L (ref 134–144)
Total Protein: 7.6 g/dL (ref 6.0–8.5)
eGFR: 52 mL/min/1.73 — ABNORMAL LOW

## 2024-10-05 LAB — CBC WITH DIFFERENTIAL/PLATELET
Basophils Absolute: 0.1 x10E3/uL (ref 0.0–0.2)
Basos: 2 %
EOS (ABSOLUTE): 0.1 x10E3/uL (ref 0.0–0.4)
Eos: 2 %
Hematocrit: 42 % (ref 34.0–46.6)
Hemoglobin: 13.4 g/dL (ref 11.1–15.9)
Immature Grans (Abs): 0 x10E3/uL (ref 0.0–0.1)
Immature Granulocytes: 0 %
Lymphocytes Absolute: 1.8 x10E3/uL (ref 0.7–3.1)
Lymphs: 50 %
MCH: 28.5 pg (ref 26.6–33.0)
MCHC: 31.9 g/dL (ref 31.5–35.7)
MCV: 89 fL (ref 79–97)
Monocytes Absolute: 0.5 x10E3/uL (ref 0.1–0.9)
Monocytes: 13 %
Neutrophils Absolute: 1.2 x10E3/uL — ABNORMAL LOW (ref 1.4–7.0)
Neutrophils: 33 %
Platelets: 229 x10E3/uL (ref 150–450)
RBC: 4.7 x10E6/uL (ref 3.77–5.28)
RDW: 12.7 % (ref 11.7–15.4)
WBC: 3.5 x10E3/uL (ref 3.4–10.8)

## 2024-10-05 LAB — LIPID PANEL
Chol/HDL Ratio: 3 ratio (ref 0.0–4.4)
Cholesterol, Total: 170 mg/dL (ref 100–199)
HDL: 57 mg/dL
LDL Chol Calc (NIH): 100 mg/dL — ABNORMAL HIGH (ref 0–99)
Triglycerides: 70 mg/dL (ref 0–149)
VLDL Cholesterol Cal: 13 mg/dL (ref 5–40)

## 2024-10-05 LAB — MICROALBUMIN / CREATININE URINE RATIO
Creatinine, Urine: 158.6 mg/dL
Microalb/Creat Ratio: 6 mg/g{creat} (ref 0–29)
Microalbumin, Urine: 9.4 ug/mL

## 2024-10-05 LAB — HEMOGLOBIN A1C
Est. average glucose Bld gHb Est-mCnc: 97 mg/dL
Hgb A1c MFr Bld: 5 % (ref 4.8–5.6)

## 2024-10-17 ENCOUNTER — Ambulatory Visit: Payer: Self-pay | Admitting: Nurse Practitioner

## 2025-04-03 ENCOUNTER — Ambulatory Visit: Payer: Self-pay | Admitting: Nurse Practitioner

## 2025-10-05 ENCOUNTER — Encounter: Payer: Self-pay | Admitting: Nurse Practitioner
